# Patient Record
Sex: Female | Born: 1995 | Race: Black or African American | Hispanic: No | Marital: Single | Smoking: Never smoker
Health system: Southern US, Community
[De-identification: ages and names within clinical notes are randomized; demographics above are authoritative.]

## PROBLEM LIST (undated history)

## (undated) DIAGNOSIS — N390 Urinary tract infection, site not specified: Secondary | ICD-10-CM

## (undated) DIAGNOSIS — F32A Depression, unspecified: Secondary | ICD-10-CM

## (undated) DIAGNOSIS — I10 Essential (primary) hypertension: Secondary | ICD-10-CM

## (undated) DIAGNOSIS — F329 Major depressive disorder, single episode, unspecified: Secondary | ICD-10-CM

## (undated) HISTORY — DX: Major depressive disorder, single episode, unspecified: F32.9

## (undated) HISTORY — DX: Depression, unspecified: F32.A

## (undated) HISTORY — DX: Essential (primary) hypertension: I10

## (undated) HISTORY — DX: Urinary tract infection, site not specified: N39.0

---

## 2018-09-10 ENCOUNTER — Encounter: Payer: Self-pay | Admitting: Family Medicine

## 2018-09-10 ENCOUNTER — Ambulatory Visit (INDEPENDENT_AMBULATORY_CARE_PROVIDER_SITE_OTHER): Payer: Managed Care, Other (non HMO) | Admitting: Family Medicine

## 2018-09-10 VITALS — BP 110/70 | HR 76 | Temp 98.9°F | Ht 66.0 in | Wt 141.0 lb

## 2018-09-10 DIAGNOSIS — Z87898 Personal history of other specified conditions: Secondary | ICD-10-CM | POA: Diagnosis not present

## 2018-09-10 DIAGNOSIS — R634 Abnormal weight loss: Secondary | ICD-10-CM | POA: Diagnosis not present

## 2018-09-10 DIAGNOSIS — Z7689 Persons encountering health services in other specified circumstances: Secondary | ICD-10-CM | POA: Diagnosis not present

## 2018-09-10 LAB — POCT GLYCOSYLATED HEMOGLOBIN (HGB A1C): Hemoglobin A1C: 5.1 % (ref 4.0–5.6)

## 2018-09-10 NOTE — Patient Instructions (Signed)

## 2018-09-10 NOTE — Progress Notes (Signed)
Patient presents to clinic today to establish care.  SUBJECTIVE: PMH:  Pt is a 23 yo female with pmh sig for preDM.   Pt previously seen in [place], [place].  PreDM?: -pt states she was told this in the past -does not recall what her hgb A1C was -states her mother has preDM -denies increased thirst, hunger, or urinary frequency  Weight loss: -x 2 wks -may only eat once a day if that. -notes decreased appetite -was 156 lbs, now 141 lbs -drinking 1-2 bottles of water per day -denies constipation, diarrhea, palpitations, hair loss, irregular menses.  Allergies: NKDA  Social Hx:  Pt is engagged.  Pt has two children, a 2 yo and a 9 mo.  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  Pt was working at a [employer].  Pt endorses rare social EtOH use.  Pt denies tobacco and drug use.  Family medical history: Mom-prediabetes, miscarriage Dad-hypertension MGM-colon cancer, diabetes, glaucoma   History reviewed. No pertinent past medical history.  History reviewed. No pertinent surgical history.  No current outpatient medications on file prior to visit.   No current facility-administered medications on file prior to visit.     No Known Allergies  History reviewed. No pertinent family history.  Social History   Socioeconomic History  . Marital status: Single    Spouse name: Not on file  . Number of children: Not on file  . Years of education: Not on file  . Highest education level: Not on file  Occupational History  . Not on file  Social Needs  . Financial resource strain: Not on file  . Food insecurity:    Worry: Not on file    Inability: Not on file  . Transportation needs:    Medical: Not on file    Non-medical: Not on file  Tobacco Use  . Smoking status: Never Smoker  . Smokeless tobacco: Never Used  Substance and Sexual Activity  . Alcohol use: Yes  . Drug use: Never  . Sexual activity: Yes  Lifestyle  . Physical activity:    Days  per week: Not on file    Minutes per session: Not on file  . Stress: Not on file  Relationships  . Social connections:    Talks on phone: Not on file    Gets together: Not on file    Attends religious service: Not on file    Active member of club or organization: Not on file    Attends meetings of clubs or organizations: Not on file    Relationship status: Not on file  . Intimate partner violence:    Fear of current or ex partner: Not on file    Emotionally abused: Not on file    Physically abused: Not on file    Forced sexual activity: Not on file  Other Topics Concern  . Not on file  Social History Narrative  . Not on file    ROS General: Denies fever, chills, night sweats, changes in appetite  +changes in weight HEENT: Denies headaches, ear pain, changes in vision, rhinorrhea, sore throat CV: Denies CP, palpitations, SOB, orthopnea Pulm: Denies SOB, cough, wheezing GI: Denies abdominal pain, nausea, vomiting, diarrhea, constipation GU: Denies dysuria, hematuria, frequency, vaginal discharge Msk: Denies muscle cramps, joint pains Neuro: Denies weakness, numbness, tingling Skin: Denies rashes, bruising Psych: Denies depression, anxiety, hallucinations  BP 110/70 (BP Location: Left Arm, Patient Position: Sitting, Cuff Size: Normal)  Pulse 76   Temp 98.9 F (37.2 C) (Oral)   Ht 5\' 6"  (1.676 m)   Wt 141 lb (64 kg)   LMP 08/31/2018 (Exact Date)   SpO2 98%   BMI 22.76 kg/m   Physical Exam Gen. Pleasant, well developed, well-nourished, in NAD HEENT - [place]/AT, PERRL, no scleral icterus, no nasal drainage, pharynx without erythema or exudate. Lungs: no use of accessory muscles, CTAB, no wheezes, rales or rhonchi Cardiovascular: RRR, No r/g/m, no peripheral edema Abdomen: BS present, soft, nontender,nondistended Neuro:  A&Ox3, CN II-XII intact, normal gait Skin:  Warm, dry, intact, no lesions  No results found for this or any previous visit (from the past 2160  hour(s)).  Assessment/Plan: Weight loss -Discussed likely 2/2 patient's decreased appetite/not eating -Discussed ways to manage stress which could also be affecting patient's weight -Discussed obtaining labs.  Will wait until patient schedule CPE. -Patient encouraged to schedule eating small meals throughout the day. -PHQ 9 score 7 -Gad 7 score 5  History of prediabetes  - Plan: POCT glycosylated hemoglobin (Hb A1C)  5.1%  Encounter to establish care -We reviewed the PMH, PSH, FH, SH, Meds and Allergies. -We provided refills for any medications we will prescribe as needed. -We addressed current concerns per orders and patient instructions. -We have asked for records for pertinent exams, studies, vaccines and notes from previous providers. -We have advised patient to follow up per instructions below.  F/u in the next few wks for CPE  Abbe Amsterdam, MD

## 2018-09-19 ENCOUNTER — Encounter: Payer: Managed Care, Other (non HMO) | Admitting: Family Medicine

## 2019-02-14 ENCOUNTER — Encounter (HOSPITAL_COMMUNITY): Payer: Self-pay | Admitting: Emergency Medicine

## 2019-02-14 ENCOUNTER — Emergency Department (HOSPITAL_COMMUNITY)
Admission: EM | Admit: 2019-02-14 | Discharge: 2019-02-14 | Disposition: A | Discharge status: Home / Self Care | Payer: Medicaid Other | Attending: Emergency Medicine | Admitting: Emergency Medicine

## 2019-02-14 ENCOUNTER — Other Ambulatory Visit: Payer: Self-pay

## 2019-02-14 DIAGNOSIS — Z008 Encounter for other general examination: Secondary | ICD-10-CM | POA: Diagnosis present

## 2019-02-14 DIAGNOSIS — I10 Essential (primary) hypertension: Secondary | ICD-10-CM | POA: Diagnosis not present

## 2019-02-14 DIAGNOSIS — Z630 Problems in relationship with spouse or partner: Secondary | ICD-10-CM | POA: Diagnosis not present

## 2019-02-14 DIAGNOSIS — F10920 Alcohol use, unspecified with intoxication, uncomplicated: Secondary | ICD-10-CM | POA: Diagnosis not present

## 2019-02-14 DIAGNOSIS — F329 Major depressive disorder, single episode, unspecified: Secondary | ICD-10-CM | POA: Insufficient documentation

## 2019-02-14 DIAGNOSIS — F4321 Adjustment disorder with depressed mood: Secondary | ICD-10-CM

## 2019-02-14 DIAGNOSIS — R45851 Suicidal ideations: Secondary | ICD-10-CM | POA: Diagnosis not present

## 2019-02-14 LAB — ACETAMINOPHEN LEVEL: Acetaminophen (Tylenol), Serum: <10 ug/mL — ABNORMAL LOW (ref 10–30)

## 2019-02-14 LAB — CBC WITH DIFFERENTIAL/PLATELET
Abs Immature Granulocytes: 0.03 10*3/uL (ref 0.00–0.07)
Basophils Absolute: 0 10*3/uL (ref 0.0–0.1)
Basophils Relative: 0 %
Eosinophils Absolute: 0.1 10*3/uL (ref 0.0–0.5)
Eosinophils Relative: 1 %
HCT: 39.4 % (ref 36.0–46.0)
Hemoglobin: 12.3 g/dL (ref 12.0–15.0)
Immature Granulocytes: 0 %
Lymphocytes Relative: 33 %
Lymphs Abs: 2.3 10*3/uL (ref 0.7–4.0)
MCH: 28 pg (ref 26.0–34.0)
MCHC: 31.2 g/dL (ref 30.0–36.0)
MCV: 89.5 fL (ref 80.0–100.0)
Monocytes Absolute: 0.4 10*3/uL (ref 0.1–1.0)
Monocytes Relative: 6 %
Neutro Abs: 4.2 10*3/uL (ref 1.7–7.7)
Neutrophils Relative %: 60 %
Platelets: 207 10*3/uL (ref 150–400)
RBC: 4.4 MIL/uL (ref 3.87–5.11)
RDW: 12.7 % (ref 11.5–15.5)
WBC: 6.9 10*3/uL (ref 4.0–10.5)
nRBC: 0 % (ref 0.0–0.2)

## 2019-02-14 LAB — COMPREHENSIVE METABOLIC PANEL
ALT: 15 U/L (ref 0–44)
AST: 19 U/L (ref 15–41)
Albumin: 4.5 g/dL (ref 3.5–5.0)
Alkaline Phosphatase: 49 U/L (ref 38–126)
Anion gap: 10 (ref 5–15)
BUN: 11 mg/dL (ref 6–20)
CO2: 25 mmol/L (ref 22–32)
Calcium: 9.3 mg/dL (ref 8.9–10.3)
Chloride: 108 mmol/L (ref 98–111)
Creatinine, Ser: 0.75 mg/dL (ref 0.44–1.00)
GFR calc Af Amer: >60 mL/min (ref >60)
GFR calc non Af Amer: >60 mL/min (ref >60)
Glucose, Bld: 87 mg/dL (ref 70–99)
Potassium: 3.8 mmol/L (ref 3.5–5.1)
Sodium: 143 mmol/L (ref 135–145)
Total Bilirubin: 0.5 mg/dL (ref 0.3–1.2)
Total Protein: 8.1 g/dL (ref 6.5–8.1)

## 2019-02-14 LAB — I-STAT BETA HCG BLOOD, ED (MC, WL, AP ONLY): I-stat hCG, quantitative: <5 m[IU]/mL (ref <5)

## 2019-02-14 LAB — RAPID URINE DRUG SCREEN, HOSP PERFORMED
Amphetamines: NOT DETECTED
Barbiturates: NOT DETECTED
Benzodiazepines: NOT DETECTED
Cocaine: NOT DETECTED
Opiates: NOT DETECTED
Tetrahydrocannabinol: NOT DETECTED

## 2019-02-14 LAB — SALICYLATE LEVEL: Salicylate Lvl: <7 mg/dL (ref 2.8–30.0)

## 2019-02-14 LAB — ETHANOL: Alcohol, Ethyl (B): 130 mg/dL — ABNORMAL HIGH (ref <10)

## 2019-02-14 NOTE — ED Notes (Signed)
Patient called a cab for ride home.

## 2019-02-14 NOTE — Discharge Instructions (Signed)
For your behavioral health needs, you are advised to follow up with one of the providers listed below.  They offer outpatient psychiatry, counseling, and substance abuse treatment.  Contact them at your earliest opportunity to ask about scheduling an intake appointment:       [facility]. [address], [place] [address]      [phone]       [facility] [place], [place] [address]      [phone]      New patients are seen at their walk-in clinic.  Walk-in hours are Monday - Friday from 8:30 am - 12:00 pm, and from 1:00 pm - 2:30 pm.  Walk-in patients are seen on a first come, first served basis, so try to arrive as early as possible for the best chance of being seen the same day.       [facility]      [place], [place] [address]      [phone]

## 2019-02-14 NOTE — ED Triage Notes (Signed)
Pt having marital stress d/t break up with husband . Drinking large amounts of ETOH verbalized SI to EMS staff. Currently intoxicated.

## 2019-02-14 NOTE — ED Notes (Addendum)
Patient given breakfast meal tray.   Patient eating.   Patient denies SI or HI.

## 2019-02-14 NOTE — BH Assessment (Signed)
[facility] Assessment Progress Note  Per Corena Pilgrim, MD, this pt does not require psychiatric hospitalization at this time.  Pt is to be discharged from [facility] with referral information for area behavioral health providers.  Discharge instructions include referrals for the [facility], for [place], and for the [place].  Pt's nurse has been notified.  Jalene Mullet, [facility] Triage Specialist [phone]

## 2019-02-14 NOTE — ED Provider Notes (Signed)
Patient was seen last night for suicidal ideation after she was involved in a verbal argument with her boyfriend.  She was found to be slightly intoxicated.  At this time she denies any SI HI.  She has been seen and evaluated by psychiatry and felt that she is stable for discharge.  She is currently clinically sober.  Will discharge per psych recommendation.  BP (!) 116/53   Pulse 67   Temp 98.4 F (36.9 C) (Oral)   Resp 14   SpO2 97%   Results for orders placed or performed during the hospital encounter of 02/14/19  CBC with Differential  Result Value Ref Range   WBC 6.9 4.0 - 10.5 K/uL   RBC 4.40 3.87 - 5.11 MIL/uL   Hemoglobin 12.3 12.0 - 15.0 g/dL   HCT 39.4 36.0 - 46.0 %   MCV 89.5 80.0 - 100.0 fL   MCH 28.0 26.0 - 34.0 pg   MCHC 31.2 30.0 - 36.0 g/dL   RDW 12.7 11.5 - 15.5 %   Platelets 207 150 - 400 K/uL   nRBC 0.0 0.0 - 0.2 %   Neutrophils Relative % 60 %   Neutro Abs 4.2 1.7 - 7.7 K/uL   Lymphocytes Relative 33 %   Lymphs Abs 2.3 0.7 - 4.0 K/uL   Monocytes Relative 6 %   Monocytes Absolute 0.4 0.1 - 1.0 K/uL   Eosinophils Relative 1 %   Eosinophils Absolute 0.1 0.0 - 0.5 K/uL   Basophils Relative 0 %   Basophils Absolute 0.0 0.0 - 0.1 K/uL   Immature Granulocytes 0 %   Abs Immature Granulocytes 0.03 0.00 - 0.07 K/uL  Comprehensive metabolic panel  Result Value Ref Range   Sodium 143 135 - 145 mmol/L   Potassium 3.8 3.5 - 5.1 mmol/L   Chloride 108 98 - 111 mmol/L   CO2 25 22 - 32 mmol/L   Glucose, Bld 87 70 - 99 mg/dL   BUN 11 6 - 20 mg/dL   Creatinine, Ser 0.75 0.44 - 1.00 mg/dL   Calcium 9.3 8.9 - 10.3 mg/dL   Total Protein 8.1 6.5 - 8.1 g/dL   Albumin 4.5 3.5 - 5.0 g/dL   AST 19 15 - 41 U/L   ALT 15 0 - 44 U/L   Alkaline Phosphatase 49 38 - 126 U/L   Total Bilirubin 0.5 0.3 - 1.2 mg/dL   GFR calc non Af Amer >60 >60 mL/min   GFR calc Af Amer >60 >60 mL/min   Anion gap 10 5 - 15  Ethanol  Result Value Ref Range   Alcohol, Ethyl (B) 130 (H) <10 mg/dL   Rapid urine drug screen (hospital performed)  Result Value Ref Range   Opiates NONE DETECTED NONE DETECTED   Cocaine NONE DETECTED NONE DETECTED   Benzodiazepines NONE DETECTED NONE DETECTED   Amphetamines NONE DETECTED NONE DETECTED   Tetrahydrocannabinol NONE DETECTED NONE DETECTED   Barbiturates NONE DETECTED NONE DETECTED  Acetaminophen level  Result Value Ref Range   Acetaminophen (Tylenol), Serum <10 (L) 10 - 30 ug/mL  Salicylate level  Result Value Ref Range   Salicylate Lvl <1.6 2.8 - 30.0 mg/dL  I-Stat Beta hCG blood, ED ([facility], [facility], [facility] only)  Result Value Ref Range   I-stat hCG, quantitative <5.0 <5 mIU/mL   Comment 3           No results found.    Domenic Moras, PA-C 02/14/19 1231    Virgel Manifold, MD 02/15/19 [phone]

## 2019-02-14 NOTE — Progress Notes (Addendum)
Patient ID: Elizabeth Blake, female   DOB: 1995/08/17, 23 y.o.   MRN: [id number]  Pt was seen and chart reviewed with treatment team and Dr Darleene Cleaver. Pt denies suicidal/homicidal ideation, denies auditory/visual hallucinations and does not appear to be responding to internal stimuli. Pt stated she is divorcing her husband but he is not accepting of this. They were arguing and she attempted to drink her troubles away. BAL 130, UDS negative. She stated she does want to speak to a therapist to learn how to better handle her emotions and reaction to her life circumstances. Pt will be provided outpatient resources for therapy. Pt declined Peer Support services. Pt is psychiatrically clear.   Ethelene Hal, PMHNP-BC 02/14/2019   1552 Patient seen face-to-face for psychiatric evaluation, chart reviewed and case discussed with the physician extender and developed treatment plan. Reviewed the information documented and agree with the treatment plan. Corena Pilgrim, MD

## 2019-02-14 NOTE — ED Provider Notes (Signed)
[facility]-EMERGENCY DEPT Provider Note   CSN: [id number] Arrival date & time: 02/14/19  0007     History   Chief Complaint Chief Complaint  Patient presents with  . Medical Clearance  . Alcohol Intoxication    HPI Elizabeth Blake is a 23 y.o. female.     The history is provided by the patient and medical records.     23 year old female with history of depression, hypertension, presenting to the ED with suicidal ideation.  She recently separated from her husband and has been drinking large amounts of alcohol to cope.  She has been very depressed and voiced SI on arrival to ED.  No plan given.  She is unable to quantify how much alcohol she drank today-- reported to other ED staff that she drank 2 bottles of wine and 2 bottles of Hennessey.  Denies drug use.  Not currently on medications for depression.  Past Medical History:  Diagnosis Date  . Depression   . Hypertension   . UTI (urinary tract infection)     There are no active problems to display for this patient.   No past surgical history on file.   OB History   No obstetric history on file.      Home Medications    Prior to Admission medications   Not on File    Family History Family History  Problem Relation Age of Onset  . Miscarriages / [place] Mother   . Hypertension Father   . Cancer Maternal Grandmother   . Diabetes Maternal Grandmother     Social History Social History   Tobacco Use  . Smoking status: Never Smoker  . Smokeless tobacco: Never Used  Substance Use Topics  . Alcohol use: Yes  . Drug use: Never     Allergies   Patient has no known allergies.   Review of Systems Review of Systems  Psychiatric/Behavioral: Positive for suicidal ideas.  All other systems reviewed and are negative.    Physical Exam Updated Vital Signs BP (!) 132/93 (BP Location: Left Arm)   Pulse 90   Temp 98.3 F (36.8 C) (Oral)   Resp 16   SpO2 100%   Physical  Exam Vitals signs and nursing note reviewed.  Constitutional:      Appearance: She is well-developed.     Comments: Sleeping, awoken for exam but clinically intoxicated  HENT:     Head: Normocephalic and atraumatic.  Eyes:     Conjunctiva/sclera: Conjunctivae normal.     Pupils: Pupils are equal, round, and reactive to light.  Neck:     Musculoskeletal: Normal range of motion.  Cardiovascular:     Rate and Rhythm: Normal rate and regular rhythm.     Heart sounds: Normal heart sounds.  Pulmonary:     Effort: Pulmonary effort is normal.     Breath sounds: Normal breath sounds.  Abdominal:     General: Bowel sounds are normal.     Palpations: Abdomen is soft.  Musculoskeletal: Normal range of motion.  Skin:    General: Skin is warm and dry.  Neurological:     Mental Status: She is alert and oriented to person, place, and time.  Psychiatric:        Thought Content: Thought content includes suicidal ideation.      ED Treatments / Results  Labs (all labs ordered are listed, but only abnormal results are displayed) Labs Reviewed  ETHANOL - Abnormal; Notable for the following components:  Result Value   Alcohol, Ethyl (B) 130 (*)    All other components within normal limits  ACETAMINOPHEN LEVEL - Abnormal; Notable for the following components:   Acetaminophen (Tylenol), Serum <10 (*)    All other components within normal limits  CBC WITH DIFFERENTIAL/PLATELET  COMPREHENSIVE METABOLIC PANEL  SALICYLATE LEVEL  RAPID URINE DRUG SCREEN, HOSP PERFORMED  I-STAT BETA HCG BLOOD, ED ([facility], [facility], [facility] ONLY)    EKG None  Radiology No results found.  Procedures Procedures (including critical care time)  Medications Ordered in ED Medications - No data to display   Initial Impression / Assessment and Plan / ED Course  I have reviewed the triage vital signs and the nursing notes.  Pertinent labs & imaging results that were available during my care of the patient were reviewed  by me and considered in my medical decision making (see chart for details).  23 year old female here acutely intoxicated with reported suicidal ideation.  Recent separation from her husband and has been depressed.  No specific plan voiced.  She appears intoxicated here but is in no acute distress.  Screening of the overall reassuring, ethanol 130.  Patient is medically cleared.  She has been resting comfortably.  Will get TTS consult.  6:26 AM TTS evaluation pending.  Care will be signed out to morning provider to follow-up on their recommendations.  Final Clinical Impressions(s) / ED Diagnoses   Final diagnoses:  Alcoholic intoxication without complication [facility])  Suicidal ideation    ED Discharge Orders    None       Larene Pickett, PA-C 02/14/19 5093    Merrily Pew, MD 02/15/19 [phone]

## 2020-01-08 DIAGNOSIS — Z419 Encounter for procedure for purposes other than remedying health state, unspecified: Secondary | ICD-10-CM | POA: Diagnosis not present

## 2020-02-05 DIAGNOSIS — O09899 Supervision of other high risk pregnancies, unspecified trimester: Secondary | ICD-10-CM | POA: Diagnosis not present

## 2020-02-08 DIAGNOSIS — Z419 Encounter for procedure for purposes other than remedying health state, unspecified: Secondary | ICD-10-CM | POA: Diagnosis not present

## 2020-02-19 DIAGNOSIS — O09899 Supervision of other high risk pregnancies, unspecified trimester: Secondary | ICD-10-CM | POA: Diagnosis not present

## 2020-03-04 DIAGNOSIS — O09899 Supervision of other high risk pregnancies, unspecified trimester: Secondary | ICD-10-CM | POA: Diagnosis not present

## 2020-03-10 DIAGNOSIS — Z419 Encounter for procedure for purposes other than remedying health state, unspecified: Secondary | ICD-10-CM | POA: Diagnosis not present

## 2020-04-09 DIAGNOSIS — Z419 Encounter for procedure for purposes other than remedying health state, unspecified: Secondary | ICD-10-CM | POA: Diagnosis not present

## 2020-05-10 DIAGNOSIS — Z419 Encounter for procedure for purposes other than remedying health state, unspecified: Secondary | ICD-10-CM | POA: Diagnosis not present

## 2020-06-09 DIAGNOSIS — Z419 Encounter for procedure for purposes other than remedying health state, unspecified: Secondary | ICD-10-CM | POA: Diagnosis not present

## 2020-07-10 DIAGNOSIS — Z419 Encounter for procedure for purposes other than remedying health state, unspecified: Secondary | ICD-10-CM | POA: Diagnosis not present

## 2020-08-10 DIAGNOSIS — Z419 Encounter for procedure for purposes other than remedying health state, unspecified: Secondary | ICD-10-CM | POA: Diagnosis not present

## 2020-09-07 DIAGNOSIS — Z419 Encounter for procedure for purposes other than remedying health state, unspecified: Secondary | ICD-10-CM | POA: Diagnosis not present

## 2020-10-08 DIAGNOSIS — Z419 Encounter for procedure for purposes other than remedying health state, unspecified: Secondary | ICD-10-CM | POA: Diagnosis not present

## 2020-10-16 DIAGNOSIS — B354 Tinea corporis: Secondary | ICD-10-CM | POA: Diagnosis not present

## 2020-10-16 DIAGNOSIS — N3000 Acute cystitis without hematuria: Secondary | ICD-10-CM | POA: Diagnosis not present

## 2020-11-07 DIAGNOSIS — Z419 Encounter for procedure for purposes other than remedying health state, unspecified: Secondary | ICD-10-CM | POA: Diagnosis not present

## 2020-12-08 DIAGNOSIS — Z419 Encounter for procedure for purposes other than remedying health state, unspecified: Secondary | ICD-10-CM | POA: Diagnosis not present

## 2020-12-09 DIAGNOSIS — Z3042 Encounter for surveillance of injectable contraceptive: Secondary | ICD-10-CM | POA: Diagnosis not present

## 2020-12-13 DIAGNOSIS — L309 Dermatitis, unspecified: Secondary | ICD-10-CM | POA: Diagnosis not present

## 2021-01-07 DIAGNOSIS — Z419 Encounter for procedure for purposes other than remedying health state, unspecified: Secondary | ICD-10-CM | POA: Diagnosis not present

## 2021-02-07 DIAGNOSIS — Z419 Encounter for procedure for purposes other than remedying health state, unspecified: Secondary | ICD-10-CM | POA: Diagnosis not present

## 2021-03-10 DIAGNOSIS — Z419 Encounter for procedure for purposes other than remedying health state, unspecified: Secondary | ICD-10-CM | POA: Diagnosis not present

## 2021-04-05 ENCOUNTER — Other Ambulatory Visit: Payer: Self-pay

## 2021-04-05 ENCOUNTER — Ambulatory Visit (HOSPITAL_COMMUNITY)
Admission: EM | Admit: 2021-04-05 | Discharge: 2021-04-05 | Disposition: A | Discharge status: Home / Self Care | Payer: 59 | Attending: Student | Admitting: Student

## 2021-04-05 ENCOUNTER — Encounter (HOSPITAL_COMMUNITY): Payer: Self-pay | Admitting: Emergency Medicine

## 2021-04-05 DIAGNOSIS — Z1152 Encounter for screening for COVID-19: Secondary | ICD-10-CM | POA: Diagnosis present

## 2021-04-05 DIAGNOSIS — R509 Fever, unspecified: Secondary | ICD-10-CM

## 2021-04-05 LAB — POC INFLUENZA A AND B ANTIGEN (URGENT CARE ONLY)
INFLUENZA A ANTIGEN, POC: NEGATIVE
INFLUENZA B ANTIGEN, POC: NEGATIVE

## 2021-04-05 NOTE — Discharge Instructions (Addendum)
-  You can take Tylenol up to 1000 mg 3 times daily, and ibuprofen up to 600 mg 3 times daily with food.  You can take these together, or alternate every 3-4 hours. -Mucinex, dayquil, nyquil, etc -With a virus, you're typically contagious for 5-7 days, or as long as you're having fevers.

## 2021-04-05 NOTE — ED Provider Notes (Signed)
[facility]    CSN: [id number] Arrival date & time: 04/05/21  1702      History   Chief Complaint Chief Complaint  Patient presents with   flu like symptoms     HPI Elizabeth Blake is a 25 y.o. female presenting with viral symptoms for about 3 days.  Medical history noncontributory.  Notes subjective chills, generalized body aches, nausea without vomiting, throbbing headaches, few episodes of diarrhea, chest congestion, nonproductive cough.  Has not tried medications for symptoms. Has not monitored temperature at home.  HPI  Past Medical History:  Diagnosis Date   Depression    Hypertension    UTI (urinary tract infection)     Patient Active Problem List   Diagnosis Date Noted   Adjustment disorder with depressed mood 02/14/2019    History reviewed. No pertinent surgical history.  OB History   No obstetric history on file.      Home Medications    Prior to Admission medications   Not on File    Family History Family History  Problem Relation Age of Onset   Miscarriages / [place] Mother    Hypertension Father    Cancer Maternal Grandmother    Diabetes Maternal Grandmother     Social History Social History   Tobacco Use   Smoking status: Never   Smokeless tobacco: Never  Vaping Use   Vaping Use: Never used  Substance Use Topics   Alcohol use: Yes   Drug use: Never     Allergies   Patient has no known allergies.   Review of Systems Review of Systems  Constitutional:  Positive for chills and fever. Negative for appetite change.  HENT:  Positive for congestion and sore throat. Negative for ear pain, rhinorrhea, sinus pressure and sinus pain.   Eyes:  Negative for redness and visual disturbance.  Respiratory:  Positive for cough. Negative for chest tightness, shortness of breath and wheezing.   Cardiovascular:  Negative for chest pain and palpitations.  Gastrointestinal:  Negative for abdominal pain, constipation, diarrhea,  nausea and vomiting.  Genitourinary:  Negative for dysuria, frequency and urgency.  Musculoskeletal:  Positive for myalgias.  Neurological:  Positive for headaches. Negative for dizziness and weakness.  Psychiatric/Behavioral:  Negative for confusion.   All other systems reviewed and are negative.   Physical Exam Triage Vital Signs ED Triage Vitals  Enc Vitals Group     BP 04/05/21 1754 132/82     Pulse Rate 04/05/21 1754 (!) 108     Resp --      Temp 04/05/21 1754 100.1 F (37.8 C)     Temp Source 04/05/21 1754 Oral     SpO2 04/05/21 1754 95 %     Weight --      Height --      Head Circumference --      Peak Flow --      Pain Score 04/05/21 1756 8     Pain Loc --      Pain Edu? --      Excl. in GC? --    No data found.  Updated Vital Signs BP 132/82 (BP Location: Right Arm)   Pulse (!) 108   Temp 100.1 F (37.8 C) (Oral)   LMP  (LMP Unknown)   SpO2 95%   Visual Acuity Right Eye Distance:   Left Eye Distance:   Bilateral Distance:    Right Eye Near:   Left Eye Near:    Bilateral Near:  Physical Exam Vitals reviewed.  Constitutional:      General: She is not in acute distress.    Appearance: Normal appearance. She is ill-appearing.  HENT:     Head: Normocephalic and atraumatic.     Right Ear: Tympanic membrane, ear canal and external ear normal. No tenderness. No middle ear effusion. There is no impacted cerumen. Tympanic membrane is not perforated, erythematous, retracted or bulging.     Left Ear: Tympanic membrane, ear canal and external ear normal. No tenderness.  No middle ear effusion. There is no impacted cerumen. Tympanic membrane is not perforated, erythematous, retracted or bulging.     Nose: Nose normal. No congestion.     Mouth/Throat:     Mouth: Mucous membranes are moist.     Pharynx: Uvula midline. No oropharyngeal exudate or posterior oropharyngeal erythema.  Eyes:     Extraocular Movements: Extraocular movements intact.     Pupils:  Pupils are equal, round, and reactive to light.  Cardiovascular:     Rate and Rhythm: Normal rate and regular rhythm.     Heart sounds: Normal heart sounds.  Pulmonary:     Effort: Pulmonary effort is normal.     Breath sounds: Normal breath sounds. No decreased breath sounds, wheezing, rhonchi or rales.  Abdominal:     Palpations: Abdomen is soft.     Tenderness: There is no abdominal tenderness. There is no guarding or rebound.  Lymphadenopathy:     Cervical: No cervical adenopathy.     Right cervical: No superficial cervical adenopathy.    Left cervical: No superficial cervical adenopathy.  Neurological:     General: No focal deficit present.     Mental Status: She is alert and oriented to person, place, and time.  Psychiatric:        Mood and Affect: Mood normal.        Behavior: Behavior normal.        Thought Content: Thought content normal.        Judgment: Judgment normal.     UC Treatments / Results  Labs (all labs ordered are listed, but only abnormal results are displayed) Labs Reviewed  SARS CORONAVIRUS 2 (TAT 6-24 HRS)  POC INFLUENZA A AND B ANTIGEN (URGENT CARE ONLY)    EKG   Radiology No results found.  Procedures Procedures (including critical care time)  Medications Ordered in UC Medications - No data to display  Initial Impression / Assessment and Plan / UC Course  I have reviewed the triage vital signs and the nursing notes.  Pertinent labs & imaging results that were available during my care of the patient were reviewed by me and considered in my medical decision making (see chart for details).     This patient is a very pleasant 25 y.o. year old female presenting with febrile illness. Today this pt is borderline febrile and tachycardic but nontachypneic, oxygenating well on room air, no wheezes rhonchi or rales.   Rapid influenza negative. COVID PCR sent.  Patient prefers over-the-counter medications for symptomatic relief.   Work note  provided. ED return precautions discussed. Patient verbalizes understanding and agreement.    Final Clinical Impressions(s) / UC Diagnoses   Final diagnoses:  Febrile illness  Encounter for screening for COVID-19     Discharge Instructions      -You can take Tylenol up to 1000 mg 3 times daily, and ibuprofen up to 600 mg 3 times daily with food.  You can take these together, or alternate  every 3-4 hours. -Mucinex, dayquil, nyquil, etc -With a virus, you're typically contagious for 5-7 days, or as long as you're having fevers.      ED Prescriptions   None    PDMP not reviewed this encounter.   Rhys Martini, PA-C 04/05/21 [phone]

## 2021-04-05 NOTE — ED Triage Notes (Signed)
Pt c/o fever, bodyaches, nausea, HA, diarrhea, and chest congestion x 2 days.

## 2021-04-06 LAB — SARS CORONAVIRUS 2 (TAT 6-24 HRS): SARS Coronavirus 2: NEGATIVE

## 2021-04-09 DIAGNOSIS — Z419 Encounter for procedure for purposes other than remedying health state, unspecified: Secondary | ICD-10-CM | POA: Diagnosis not present

## 2021-04-16 ENCOUNTER — Emergency Department (HOSPITAL_COMMUNITY)
Admission: EM | Admit: 2021-04-16 | Discharge: 2021-04-18 | Disposition: A | Discharge status: Home / Self Care | Payer: 59 | Attending: Emergency Medicine | Admitting: Emergency Medicine

## 2021-04-16 ENCOUNTER — Other Ambulatory Visit: Payer: Self-pay

## 2021-04-16 ENCOUNTER — Emergency Department (HOSPITAL_COMMUNITY): Payer: 59

## 2021-04-16 DIAGNOSIS — N9489 Other specified conditions associated with female genital organs and menstrual cycle: Secondary | ICD-10-CM | POA: Diagnosis not present

## 2021-04-16 DIAGNOSIS — Z20822 Contact with and (suspected) exposure to covid-19: Secondary | ICD-10-CM | POA: Insufficient documentation

## 2021-04-16 DIAGNOSIS — F322 Major depressive disorder, single episode, severe without psychotic features: Secondary | ICD-10-CM | POA: Diagnosis not present

## 2021-04-16 DIAGNOSIS — X838XXA Intentional self-harm by other specified means, initial encounter: Secondary | ICD-10-CM | POA: Insufficient documentation

## 2021-04-16 DIAGNOSIS — T50902A Poisoning by unspecified drugs, medicaments and biological substances, intentional self-harm, initial encounter: Secondary | ICD-10-CM | POA: Diagnosis not present

## 2021-04-16 DIAGNOSIS — I10 Essential (primary) hypertension: Secondary | ICD-10-CM | POA: Diagnosis not present

## 2021-04-16 DIAGNOSIS — F32A Depression, unspecified: Secondary | ICD-10-CM | POA: Diagnosis present

## 2021-04-16 DIAGNOSIS — Y907 Blood alcohol level of 200-239 mg/100 ml: Secondary | ICD-10-CM | POA: Diagnosis not present

## 2021-04-16 LAB — ETHANOL: Alcohol, Ethyl (B): 212 mg/dL — ABNORMAL HIGH (ref <10)

## 2021-04-16 LAB — I-STAT BETA HCG BLOOD, ED (MC, WL, AP ONLY): I-stat hCG, quantitative: 6.3 m[IU]/mL — ABNORMAL HIGH (ref <5)

## 2021-04-16 LAB — CBC WITH DIFFERENTIAL/PLATELET
Abs Immature Granulocytes: 0.02 10*3/uL (ref 0.00–0.07)
Basophils Absolute: 0 10*3/uL (ref 0.0–0.1)
Basophils Relative: 0 %
Eosinophils Absolute: 0.1 10*3/uL (ref 0.0–0.5)
Eosinophils Relative: 1 %
HCT: 39 % (ref 36.0–46.0)
Hemoglobin: 12.5 g/dL (ref 12.0–15.0)
Immature Granulocytes: 0 %
Lymphocytes Relative: 34 %
Lymphs Abs: 2.3 10*3/uL (ref 0.7–4.0)
MCH: 27.7 pg (ref 26.0–34.0)
MCHC: 32.1 g/dL (ref 30.0–36.0)
MCV: 86.3 fL (ref 80.0–100.0)
Monocytes Absolute: 0.5 10*3/uL (ref 0.1–1.0)
Monocytes Relative: 8 %
Neutro Abs: 3.9 10*3/uL (ref 1.7–7.7)
Neutrophils Relative %: 57 %
Platelets: 292 10*3/uL (ref 150–400)
RBC: 4.52 MIL/uL (ref 3.87–5.11)
RDW: 12.9 % (ref 11.5–15.5)
WBC: 6.9 10*3/uL (ref 4.0–10.5)
nRBC: 0 % (ref 0.0–0.2)

## 2021-04-16 LAB — URINALYSIS, ROUTINE W REFLEX MICROSCOPIC
Bilirubin Urine: NEGATIVE
Glucose, UA: NEGATIVE mg/dL
Hgb urine dipstick: NEGATIVE
Ketones, ur: NEGATIVE mg/dL
Leukocytes,Ua: NEGATIVE
Nitrite: NEGATIVE
Protein, ur: NEGATIVE mg/dL
Specific Gravity, Urine: 1.002 — ABNORMAL LOW (ref 1.005–1.030)
pH: 7 (ref 5.0–8.0)

## 2021-04-16 LAB — PROTIME-INR
INR: 1 (ref 0.8–1.2)
Prothrombin Time: 13.7 seconds (ref 11.4–15.2)

## 2021-04-16 LAB — COMPREHENSIVE METABOLIC PANEL
ALT: 20 U/L (ref 0–44)
AST: 26 U/L (ref 15–41)
Albumin: 4.9 g/dL (ref 3.5–5.0)
Alkaline Phosphatase: 55 U/L (ref 38–126)
Anion gap: 9 (ref 5–15)
BUN: 8 mg/dL (ref 6–20)
CO2: 24 mmol/L (ref 22–32)
Calcium: 9.4 mg/dL (ref 8.9–10.3)
Chloride: 112 mmol/L — ABNORMAL HIGH (ref 98–111)
Creatinine, Ser: 0.85 mg/dL (ref 0.44–1.00)
GFR, Estimated: >60 mL/min (ref >60)
Glucose, Bld: 108 mg/dL — ABNORMAL HIGH (ref 70–99)
Potassium: 3.6 mmol/L (ref 3.5–5.1)
Sodium: 145 mmol/L (ref 135–145)
Total Bilirubin: 0.4 mg/dL (ref 0.3–1.2)
Total Protein: 8.3 g/dL — ABNORMAL HIGH (ref 6.5–8.1)

## 2021-04-16 LAB — BLOOD GAS, VENOUS
Acid-base deficit: 1.2 mmol/L (ref 0.0–2.0)
Bicarbonate: 23.9 mmol/L (ref 20.0–28.0)
O2 Saturation: 59.9 %
Patient temperature: 98.6
pCO2, Ven: 44.1 mmHg (ref 44.0–60.0)
pH, Ven: 7.354 (ref 7.250–7.430)
pO2, Ven: 36.9 mmHg (ref 32.0–45.0)

## 2021-04-16 LAB — RAPID URINE DRUG SCREEN, HOSP PERFORMED
Amphetamines: NOT DETECTED
Barbiturates: NOT DETECTED
Benzodiazepines: NOT DETECTED
Cocaine: NOT DETECTED
Opiates: NOT DETECTED
Tetrahydrocannabinol: NOT DETECTED

## 2021-04-16 LAB — SALICYLATE LEVEL: Salicylate Lvl: <7 mg/dL — ABNORMAL LOW (ref 7.0–30.0)

## 2021-04-16 LAB — IRON
Iron: 83 ug/dL (ref 28–170)
Iron: 256 ug/dL — ABNORMAL HIGH (ref 28–170)

## 2021-04-16 LAB — LIPASE, BLOOD: Lipase: 246 U/L — ABNORMAL HIGH (ref 11–51)

## 2021-04-16 LAB — ACETAMINOPHEN LEVEL: Acetaminophen (Tylenol), Serum: <10 ug/mL — ABNORMAL LOW (ref 10–30)

## 2021-04-16 LAB — PREGNANCY, URINE: Preg Test, Ur: NEGATIVE

## 2021-04-16 LAB — RESP PANEL BY RT-PCR (FLU A&B, COVID) ARPGX2
Influenza A by PCR: NEGATIVE
Influenza B by PCR: NEGATIVE
SARS Coronavirus 2 by RT PCR: NEGATIVE

## 2021-04-16 LAB — APTT: aPTT: 25 seconds (ref 24–36)

## 2021-04-16 MED ORDER — ONDANSETRON HCL 4 MG/2ML IJ SOLN
4.0000 mg | Freq: Once | INTRAMUSCULAR | Status: AC
  Administered 2021-04-16: 4 mg via INTRAVENOUS
  Filled 2021-04-16: qty 2

## 2021-04-16 MED ORDER — LACTATED RINGERS IV BOLUS
1000.0000 mL | Freq: Once | INTRAVENOUS | Status: AC
  Administered 2021-04-16: 1000 mL via INTRAVENOUS

## 2021-04-16 MED ORDER — SODIUM CHLORIDE 0.9 % IV BOLUS
1000.0000 mL | Freq: Once | INTRAVENOUS | Status: AC
  Administered 2021-04-16: 1000 mL via INTRAVENOUS

## 2021-04-16 NOTE — ED Provider Notes (Signed)
[facility]-EMERGENCY DEPT Provider Note   CSN: [id number] Arrival date & time: 04/16/21  0932     History Chief Complaint  Patient presents with   SI   Drug Overdose    Elizabeth Blake is a 25 y.o. female.  HPI  24 year old female with past medical history of HTN presents the emergency department after intentional overdose.  Patient states she has been having increased stress at home taking care of her 3 kids, unable to get a job.  Feels like she cannot provide for her family.  She has been sad and depressed and wanted to take iron pills this morning as a "cry for help".  She is not sure if she wanted to kill herself or die.  Patient states that 6 AM after her husband left for work she opened a bottle of wine and took a handful of iron pills.  She takes iron pills daily for anemia. She states it was a newer bottle, she took all of the pills that were left. Bottle shows long acting iron.  Initially she threw up some of the pills but then she forced those down with more wine.  Denies any Tylenol, salicylate or other pill ingestion.  Denies any previous suicide attempt.  Currently she is complaining of mild nausea with one episode of emesis.  Denies any chest pain or shortness of breath.  Past Medical History:  Diagnosis Date   Depression    Hypertension    UTI (urinary tract infection)     Patient Active Problem List   Diagnosis Date Noted   Adjustment disorder with depressed mood 02/14/2019    No past surgical history on file.   OB History   No obstetric history on file.     Family History  Problem Relation Age of Onset   Miscarriages / Stillbirths Mother    Hypertension Father    Cancer Maternal Grandmother    Diabetes Maternal Grandmother     Social History   Tobacco Use   Smoking status: Never   Smokeless tobacco: Never  Vaping Use   Vaping Use: Never used  Substance Use Topics   Alcohol use: Yes   Drug use: Never    Home  Medications Prior to Admission medications   Not on File    Allergies    Patient has no known allergies.  Review of Systems   Review of Systems  Constitutional:  Negative for chills and fever.  HENT:  Negative for congestion.   Eyes:  Negative for visual disturbance.  Respiratory:  Negative for shortness of breath.   Cardiovascular:  Negative for chest pain.  Gastrointestinal:  Positive for nausea. Negative for abdominal pain, diarrhea and vomiting.  Genitourinary:  Negative for dysuria.  Skin:  Negative for rash.  Neurological:  Negative for headaches.  Psychiatric/Behavioral:  Positive for agitation, self-injury and suicidal ideas.    Physical Exam Updated Vital Signs BP 113/66   Pulse 65   Temp 98 F (36.7 C) (Oral)   Resp (!) 22   Ht 5\' 6"  (1.676 m)   Wt 64 kg   LMP  (LMP Unknown)   SpO2 100%   BMI 22.77 kg/m   Physical Exam Vitals and nursing note reviewed.  Constitutional:      Appearance: Normal appearance.     Comments: Tearful  HENT:     Head: Normocephalic.     Mouth/Throat:     Mouth: Mucous membranes are moist.  Eyes:  Pupils: Pupils are equal, round, and reactive to light.  Cardiovascular:     Rate and Rhythm: Normal rate.  Pulmonary:     Effort: Pulmonary effort is normal. No respiratory distress.  Abdominal:     Palpations: Abdomen is soft.     Tenderness: There is no abdominal tenderness.  Skin:    General: Skin is warm.  Neurological:     Mental Status: She is alert and oriented to person, place, and time. Mental status is at baseline.  Psychiatric:     Comments: Tearful and anxious    ED Results / Procedures / Treatments   Labs (all labs ordered are listed, but only abnormal results are displayed) Labs Reviewed  COMPREHENSIVE METABOLIC PANEL - Abnormal; Notable for the following components:      Result Value   Chloride 112 (*)    Glucose, Bld 108 (*)    Total Protein 8.3 (*)    All other components within normal limits   LIPASE, BLOOD - Abnormal; Notable for the following components:   Lipase 246 (*)    All other components within normal limits  URINALYSIS, ROUTINE W REFLEX MICROSCOPIC - Abnormal; Notable for the following components:   Color, Urine COLORLESS (*)    Specific Gravity, Urine 1.002 (*)    All other components within normal limits  ETHANOL - Abnormal; Notable for the following components:   Alcohol, Ethyl (B) 212 (*)    All other components within normal limits  SALICYLATE LEVEL - Abnormal; Notable for the following components:   Salicylate Lvl <7.0 (*)    All other components within normal limits  ACETAMINOPHEN LEVEL - Abnormal; Notable for the following components:   Acetaminophen (Tylenol), Serum <10 (*)    All other components within normal limits  I-STAT BETA HCG BLOOD, ED ([facility], [facility], [facility] ONLY) - Abnormal; Notable for the following components:   I-stat hCG, quantitative 6.3 (*)    All other components within normal limits  CBC WITH DIFFERENTIAL/PLATELET  RAPID URINE DRUG SCREEN, HOSP PERFORMED  PROTIME-INR  BLOOD GAS, VENOUS  APTT  PREGNANCY, URINE  IRON    EKG None  Radiology No results found.  Procedures .Critical Care Performed by: Rozelle Logan, DO Authorized by: Rozelle Logan, DO   Critical care provider statement:    Critical care time (minutes):  45   Critical care time was exclusive of:  Separately billable procedures and treating other patients   Critical care was necessary to treat or prevent imminent or life-threatening deterioration of the following conditions:  Toxidrome   Critical care was time spent personally by me on the following activities:  Discussions with consultants, evaluation of patient's response to treatment, examination of patient, ordering and performing treatments and interventions, ordering and review of laboratory studies, ordering and review of radiographic studies, pulse oximetry, re-evaluation of patient's condition, obtaining  history from patient or surrogate and review of old charts   Medications Ordered in ED Medications  lactated ringers bolus 1,000 mL (has no administration in time range)  sodium chloride 0.9 % bolus 1,000 mL (1,000 mLs Intravenous Bolus 04/16/21 0834)  ondansetron (ZOFRAN) injection 4 mg (4 mg Intravenous Given 04/16/21 0835)    ED Course  I have reviewed the triage vital signs and the nursing notes.  Pertinent labs & imaging results that were available during my care of the patient were reviewed by me and considered in my medical decision making (see chart for details).    MDM Rules/Calculators/A&P  25 year old female presents the emergency department with suicide attempt.  Patient admits to taking almost an entire full bottle of long-acting iron pills.  The bottle would have held 30 pills, she had been using that bottle for couple days.  Potential maximum ingestion would have been the 30 pills of the 45 mg coming up to 1350 mg.  Poison control has been contacted multiple times.  Her ingestion is well under the 40 mix per keg threshold.  Her EKG is unremarkable.  Her blood work shows no acidosis or acute electrolyte/LFT abnormality. Tylenol and Salicylate is normal. Ethanol 212.  Patient was nauseous with an episode of emesis on arrival but is otherwise been asymptomatic.  Abdominal x-ray does show retained pills.  We did a 4-hour and 8-hour ingestion due to the long-acting nature of the iron pills.  Her 8-hour iron level is 256.  Given her minimal symptoms, low iron level and otherwise unremarkable metabolic work-up Poison control recommends no further aggressive care in regards to this case.  She has been hydrated, will be allowed to eat.  Will be cleared medically for psychiatric evaluation.  Patient is under IVC.  Patient will be signed out to provider Default.   Final Clinical Impression(s) / ED Diagnoses Final diagnoses:  None    Rx / DC Orders ED Discharge  Orders     None        Rozelle Logan, DO 04/16/21 1518

## 2021-04-16 NOTE — ED Notes (Signed)
Pt's mother at bedside.

## 2021-04-16 NOTE — ED Notes (Signed)
Poison Control Contacted.  Recommendations include - hydration therapy with Lactated Ringers 20 ml/kg, Repeat labs in 2-3 hours if labs come back abnormal, abdominal xray and iron level and continuous monitoring.

## 2021-04-16 NOTE — ED Triage Notes (Signed)
Pt BIBA with [facility] Per EMS pt presents with c/o intentional drug overdose with iron tablets. Pt EMS pt called 911 after consuming a large bottle of wine and taking an entire bottle of iron tablets. The bottle contained about 30 tablets with each tablet being 45 mg.   BP 113/69 HR 81 96% room air 198 CBG  18 L AC.

## 2021-04-16 NOTE — ED Notes (Signed)
Pt dressed in burgundy scrubs, belongings collected and placed in cabinets 1-4 and wanded by security.   Belongings include: 1 t-shirt, 1 pair of pants, 1 black cell phone and ID.

## 2021-04-17 DIAGNOSIS — F322 Major depressive disorder, single episode, severe without psychotic features: Secondary | ICD-10-CM | POA: Diagnosis not present
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
## 2021-04-17 NOTE — ED Provider Notes (Signed)
Emergency Medicine Observation Re-evaluation Note  Elizabeth Blake is a 25 y.o. female, seen on rounds today.  Pt initially presented to the ED for complaints of SI and Drug Overdose Currently, the patient is sleeping.  Physical Exam  BP 122/83 (BP Location: Right Arm)   Pulse 67   Temp 98.1 F (36.7 C) (Oral)   Resp 14   Ht 5\' 6"  (1.676 m)   Wt 64 kg   LMP  (LMP Unknown)   SpO2 99%   BMI 22.77 kg/m  Physical Exam General: no distress Lungs: Resp even and unlabored Psych: Sleeping  ED Course / MDM  EKG:EKG Interpretation  Date/Time:  Saturday April 16 2021 08:56:11 EDT Ventricular Rate:  69 PR Interval:  158 QRS Duration: 82 QT Interval:  404 QTC Calculation: 433 R Axis:   92 Text Interpretation: Sinus rhythm Borderline right axis deviation NSR, normal intervals Confirmed by [id number] [phone]) on 04/16/2021 10:19:35 AM  I have reviewed the labs performed to date as well as medications administered while in observation.  Recent changes in the last 24 hours include none.  Plan  Current plan is for inpatient psych admission. 06/16/2021 is under involuntary commitment.      Pablo Ledger, MD 04/17/21 [phone]

## 2021-04-18 ENCOUNTER — Encounter (HOSPITAL_COMMUNITY): Payer: Self-pay | Admitting: Urology

## 2021-04-18 ENCOUNTER — Other Ambulatory Visit: Payer: Self-pay

## 2021-04-18 ENCOUNTER — Inpatient Hospital Stay (HOSPITAL_COMMUNITY)
Admission: AD | Admit: 2021-04-18 | Discharge: 2021-04-21 | DRG: 882 | Disposition: A | Discharge status: Home / Self Care | Payer: 59 | Source: Intra-hospital | Attending: Psychiatry | Admitting: Psychiatry

## 2021-04-18 DIAGNOSIS — T454X2A Poisoning by iron and its compounds, intentional self-harm, initial encounter: Secondary | ICD-10-CM | POA: Diagnosis present

## 2021-04-18 DIAGNOSIS — F4321 Adjustment disorder with depressed mood: Secondary | ICD-10-CM | POA: Diagnosis not present

## 2021-04-18 DIAGNOSIS — Z818 Family history of other mental and behavioral disorders: Secondary | ICD-10-CM | POA: Diagnosis not present

## 2021-04-18 DIAGNOSIS — R7989 Other specified abnormal findings of blood chemistry: Secondary | ICD-10-CM | POA: Diagnosis present

## 2021-04-18 DIAGNOSIS — R61 Generalized hyperhidrosis: Secondary | ICD-10-CM | POA: Diagnosis present

## 2021-04-18 DIAGNOSIS — E785 Hyperlipidemia, unspecified: Secondary | ICD-10-CM | POA: Diagnosis present

## 2021-04-18 DIAGNOSIS — F322 Major depressive disorder, single episode, severe without psychotic features: Secondary | ICD-10-CM | POA: Diagnosis not present

## 2021-04-18 DIAGNOSIS — F4323 Adjustment disorder with mixed anxiety and depressed mood: Secondary | ICD-10-CM | POA: Diagnosis present

## 2021-04-18 DIAGNOSIS — R7401 Elevation of levels of liver transaminase levels: Secondary | ICD-10-CM | POA: Diagnosis not present

## 2021-04-18 LAB — HEMOGLOBIN A1C
Hgb A1c MFr Bld: 5.1 % (ref 4.8–5.6)
Mean Plasma Glucose: 99.67 mg/dL

## 2021-04-18 LAB — LIPID PANEL
Cholesterol: 233 mg/dL — ABNORMAL HIGH (ref 0–200)
HDL: 48 mg/dL (ref >40)
LDL Cholesterol: 147 mg/dL — ABNORMAL HIGH (ref 0–99)
Total CHOL/HDL Ratio: 4.9 RATIO
Triglycerides: 189 mg/dL — ABNORMAL HIGH (ref <150)
VLDL: 38 mg/dL (ref 0–40)

## 2021-04-18 LAB — HCG, QUANTITATIVE, PREGNANCY: hCG, Beta Chain, Quant, S: <1 m[IU]/mL (ref <5)

## 2021-04-18 LAB — TSH: TSH: 2.372 u[IU]/mL (ref 0.350–4.500)

## 2021-04-18 MED ORDER — ACETAMINOPHEN 325 MG PO TABS: 650.0000 mg | ORAL_TABLET | Freq: Four times a day (QID) | ORAL | Status: DC | PRN

## 2021-04-18 MED ORDER — HYDROXYZINE HCL 25 MG PO TABS
25.0000 mg | ORAL_TABLET | Freq: Three times a day (TID) | ORAL | Status: DC | PRN
  Filled 2021-04-18: qty 1

## 2021-04-18 MED ORDER — TRAZODONE HCL 50 MG PO TABS
50.0000 mg | ORAL_TABLET | Freq: Every evening | ORAL | Status: DC | PRN
  Filled 2021-04-18: qty 1

## 2021-04-18 MED ORDER — ALUM & MAG HYDROXIDE-SIMETH 200-200-20 MG/5ML PO SUSP: 30.0000 mL | ORAL | Status: DC | PRN

## 2021-04-18 MED ORDER — MAGNESIUM HYDROXIDE 400 MG/5ML PO SUSP
30.0000 mL | Freq: Every day | ORAL | Status: DC | PRN
Start: 2021-04-18 — End: 2021-04-21

## 2021-04-18 NOTE — ED Notes (Signed)
Assumed care of pt at 0700.  Pt resting in [place].  Comfort needs meet.

## 2021-04-18 NOTE — Progress Notes (Signed)
Patient was invited to attend group. Patient did not attend group. 

## 2021-04-18 NOTE — ED Provider Notes (Signed)
Emergency Medicine Observation Re-evaluation Note  Elizabeth Blake is a 25 y.o. female, seen on rounds today.  Pt initially presented to the ED for complaints of SI and Drug Overdose Currently, the patient is resting comfortably, no complaints, would like to know if she can be discharged.  Physical Exam  BP 137/76   Pulse 64   Temp 98.2 F (36.8 C) (Oral)   Resp 17   Ht 5\' 6"  (1.676 m)   Wt 64 kg   LMP  (LMP Unknown)   SpO2 99%   BMI 22.77 kg/m  Physical Exam General: Awake and alert Lungs: Resp even and unlabored Psych: calm and cooperative  ED Course / MDM  EKG:EKG Interpretation  Date/Time:  Saturday April 16 2021 08:56:11 EDT Ventricular Rate:  69 PR Interval:  158 QRS Duration: 82 QT Interval:  404 QTC Calculation: 433 R Axis:   92 Text Interpretation: Sinus rhythm Borderline right axis deviation NSR, normal intervals Confirmed by [id number] [phone]) on 04/16/2021 10:19:35 AM  I have reviewed the labs performed to date as well as medications administered while in observation.  Recent changes in the last 24 hours include none.  Plan  Current plan is for inpatient psych placement, will communicate to [facility] team that patient would like to go home. [facility] is under involuntary commitment.      Pablo Ledger, MD 04/18/21 [phone]

## 2021-04-18 NOTE — Group Note (Signed)
Occupational Therapy Group Note  Group Topic:Self-Esteem  Group Date: 04/18/2021 Start Time: 1400 End Time: 1450 Facilitators: Ali Mohl, OT/L   Group Description: Group encouraged increased engagement and participation through discussion and activity focused on self-esteem. Patients explored and discussed the differences between healthy and low self-esteem and how it affects our daily lives and occupations with a focus on relationships, work, school, self-care, and personal leisure interests. Group discussion then transitioned into identifying specific strategies to boost self-esteem and engaged in a collaborative and independent activity looking at positive ways to describe oneself A-Z.   Therapeutic Goal(s): Understand and recognize the differences between healthy and low self-esteem Identify healthy strategies to improve/build self-esteem    Participation Level: Did not attend   Plan: Continue to engage patient in OT groups 2 - 3x/week.  04/18/2021  Uriyah Raska, OT/L 

## 2021-04-18 NOTE — ED Notes (Signed)
Report called and IVC sent

## 2021-04-18 NOTE — B[facility] Group Notes (Signed)
LCSW Group Therapy Note   Group Date: 04/18/2021 Start Time: 1300 End Time: 1400     Type of Therapy and Topic:  Group Therapy - How To Cope with Nervousness about Discharge    Participation Level:  Active    Description of Group This process group involved identification of patients' feelings about discharge. Some of them are scheduled to be discharged soon, while others are new admissions, but each of them was asked to share thoughts and feelings surrounding discharge from the hospital. One common theme was that they are excited at the prospect of going home, while another was that many of them are apprehensive about sharing why they were hospitalized. Patients were given the opportunity to discuss these feelings with their peers in preparation for discharge.   Therapeutic Goals   Patient will identify their overall feelings about pending discharge. Patient will think about how they might proactively address issues that they believe will once again arise once they get home (i.e. with parents). Patients will participate in discussion about having hope for change.     Summary of Patient Progress:  Elizabeth Blake stated that after discharge she would like to workout more often. Pt demonstrated insight into the subject matter, and proved open to input from peers and feedback from CSW. Pt was respectful of peers and participated throughout the entire session.     Therapeutic Modalities Cognitive Behavioral Therapy     [name], Theresia Majors 04/18/2021  2:10 PM

## 2021-04-18 NOTE — Progress Notes (Signed)
Pt attended the evening AA group and was appropriate. 

## 2021-04-18 NOTE — Progress Notes (Signed)
Pt accepted to [facility] rm 306-2  Patient meets inpatient criteria per [name], NP.    Dr. Loleta Chance / Dr. Octavia Bruckner is the attending provider.    Call report to [phone]    Megan Salon, RN @ [facility] ED notified via secure chat.     Pt scheduled  to arrive at [facility] at 1130 AM   Signed:  Corky Crafts, MSW, LCSWA, LCASA 04/18/2021 10:12 AM

## 2021-04-18 NOTE — Progress Notes (Signed)
Admission Note: Patient is a 25 year old female admitted to the unit under IVC from [facility] for suicidal ideation, depression and anxiety.  Patient had overdosed on 30 long acting iron pills along with 5 glasses of wine.  Denies SI but stated it was a cry for help.  Verbally contracts for safety while in the hospital.  Reports stressors as loneliness and looking for a job.  Stated she is here to learn coping skills and not to allow things to stress her too much.  Patient is alert and oriented x 4.  Presents with a calm affect and pleasant mood.  Admission plan of care reviewed with consent signed.  Skin and personal belongings completed.  Skin is dry and intact.  No contraband found.  Patient oriented to the unit, staff and room.  Support and encouragement offered as needed.  Verbalizes understanding of unit rules/protocols.  Routine safety checks initiated upon getting to the unit.  Patient is safe on the unit a this time.

## 2021-04-18 NOTE — Progress Notes (Signed)
Pt visible on the unit, pt stated she was feeling better today.     04/18/21 2100  Psych Admission Type (Psych Patients Only)  Admission Status Involuntary  Psychosocial Assessment  Patient Complaints Anxiety;Depression  Eye Contact Brief  Facial Expression Anxious  Affect Anxious  Speech Logical/coherent  Interaction Assertive  Motor Activity Other (Comment)  Appearance/Hygiene In scrubs  Behavior Characteristics Cooperative  Mood Anxious  Thought Process  Coherency WDL  Content WDL  Delusions None reported or observed  Perception WDL  Hallucination None reported or observed  Judgment Impaired  Confusion None  Danger to Self  Current suicidal ideation? Denies  Danger to Others  Danger to Others None reported or observed

## 2021-04-18 NOTE — Tx Team (Signed)
Initial Treatment Plan 04/18/2021 2:42 PM Elizabeth Blake KFE:[id number]    PATIENT STRESSORS: Financial difficulties   Health problems   Marital or family conflict   Occupational concerns     PATIENT STRENGTHS: Ability for insight  Average or above average intelligence  Communication skills  Motivation for treatment/growth  Supportive family/friends    PATIENT IDENTIFIED PROBLEMS: "Not to stress too much"  "Coping skills"  Suicidal ideation  Depression  Anxiety             DISCHARGE CRITERIA:  Ability to meet basic life and health needs Adequate post-discharge living arrangements Motivation to continue treatment in a less acute level of care  PRELIMINARY DISCHARGE PLAN: Attend aftercare/continuing care group Outpatient therapy Return to previous living arrangement  PATIENT/FAMILY INVOLVEMENT: This treatment plan has been presented to and reviewed with the patient, Elizabeth Blake, and/or family member.  The patient and family have been given the opportunity to ask questions and make suggestions.  Audrie Lia Elizabeth Williams, RN 04/18/2021, 2:42 PM

## 2021-04-18 NOTE — ED Notes (Signed)
Unable to DC patient at 1150.  States fluids running.  No fluids running at this time or during the visit.

## 2021-04-18 NOTE — ED Notes (Signed)
Patient was given her breakfast tray.

## 2021-04-19 DIAGNOSIS — F322 Major depressive disorder, single episode, severe without psychotic features: Secondary | ICD-10-CM

## 2021-04-19 NOTE — [facility] Group Notes (Signed)
Adult Psychoeducational Group Note  Date:  04/19/2021 Time:  9:11 PM  Group Topic/Focus:  Wrap-Up Group:   The focus of this group is to help patients review their daily goal of treatment and discuss progress on daily workbooks.  Participation Level:  Active  Participation Quality:  Appropriate and Attentive  Affect:  Appropriate  Cognitive:  Alert and Appropriate  Insight: Appropriate and Good  Engagement in Group:  Engaged  Modes of Intervention:  Discussion and Education  Additional Comments:  Pt attended and participated in wrap up group this evening and rated their day a 7/10. Pt stayed more to themself due to their "social battery being low" from lots of interacting yesterday. When the pt is D/C their goal is to ask for help more when they are overwhelmed and to work out like they used to.   Chrisandra Netters 04/19/2021, 9:11 PM

## 2021-04-19 NOTE — Progress Notes (Signed)
D:  Elizabeth Blake is out and visible on the unit.  She was on the phone much of the evening.  She denied SI/HI or AVH.  No prns medications given this evening.    A:  1:1 with RN for support and encouragement.  Medications as ordered.  Q 15 minute checks maintained for safety.  Encouraged participation in group and unit activities.   R:  She remains safe on the unit.  She is currently resting with her eyes closed and appears to be asleep.    04/19/21 2300  Psych Admission Type (Psych Patients Only)  Admission Status Involuntary  Psychosocial Assessment  Patient Complaints None  Eye Contact Fair  Facial Expression Anxious  Affect Anxious  Speech Logical/coherent  Interaction Assertive  Motor Activity Other (Comment) (unremarkable)  Appearance/Hygiene Unremarkable  Behavior Characteristics Cooperative  Mood Anxious  Thought Process  Coherency WDL  Content WDL  Delusions None reported or observed  Perception WDL  Hallucination None reported or observed  Judgment Poor  Confusion None  Danger to Self  Current suicidal ideation? Denies  Danger to Others  Danger to Others None reported or observed
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
## 2021-04-19 NOTE — [facility] Group Notes (Signed)
Adult Psychoeducational Group Note  Date:  04/19/2021 Time:  10:44 AM  Group Topic/Focus:  Goals Group:   The focus of this group is to help patients establish daily goals to achieve during treatment and discuss how the patient can incorporate goal setting into their daily lives to aide in recovery.  Participation Level:  Active  Participation Quality:  Appropriate and Attentive  Affect:  Appropriate  Cognitive:  Alert and Appropriate  Insight: Appropriate and Good  Engagement in Group:  Engaged  Modes of Intervention:  Discussion  Additional Comments:  Pt attended and participated in the morning goal group. Pt had a goal today to keep adding to her coping skills as well as asking for help.    Elizabeth Blake 04/19/2021, 10:44 AM
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
## 2021-04-19 NOTE — Group Note (Signed)
Recreation Therapy Group Note   Group Topic:Animal Assisted Therapy   Group Date: 04/19/2021 Start Time: 1430 End Time: 1515 Facilitators: Caroll Rancher, LRT/CTRS Location: [address]  [facility]T Program Assumption of Risk Form signed by Patient/ or Parent Legal Guardian Yes  Patient is free of allergies or severe asthma Yes  Patient reports no fear of animals Yes  Patient reports no history of cruelty to animals Yes  Patient understands his/her participation is voluntary Yes  Patient washes hands before animal contact Yes  Patient washes hands after animal contact Yes   Affect/Mood: Appropriate   Participation Level: Engaged   Participation Quality: Independent   Behavior: Appropriate   Speech/Thought Process: Focused   Insight: Good   Judgement: Good   Modes of Intervention: Teaching laboratory technician   Patient Response to Interventions:  Engaged   Education Outcome:  Acknowledges education and In group clarification offered    Clinical Observations/Individualized Feedback: Patient attended session and interacted appropriately with therapy dog and peers. Patient asked appropriate questions about therapy dog and his training. Patient shared stories about their pets at home with group.     Plan: Continue to engage patient in RT group sessions 2-3x/week.   Caroll Rancher, LRT/CTRS 04/19/2021 3:46 PM

## 2021-04-19 NOTE — Progress Notes (Signed)
Pt denies SI/HI/AVH.  Pt denies having depression or anxiety.  Pt disclosed "I don't need any medications."  RN established rapport with pt and assessed for needs and concerns.  Pt pleasant and cooperative this shift.  Pt remains safe with q 15 min room checks in place.

## 2021-04-19 NOTE — [facility] Group Notes (Signed)
Pt did not attend afternoon group. 

## 2021-04-20 ENCOUNTER — Encounter (HOSPITAL_COMMUNITY): Payer: Self-pay

## 2021-04-20 ENCOUNTER — Inpatient Hospital Stay (HOSPITAL_COMMUNITY): Payer: 59

## 2021-04-20 DIAGNOSIS — R7401 Elevation of levels of liver transaminase levels: Secondary | ICD-10-CM

## 2021-04-20 DIAGNOSIS — F4321 Adjustment disorder with depressed mood: Secondary | ICD-10-CM

## 2021-04-20 LAB — COMPREHENSIVE METABOLIC PANEL
ALT: 53 U/L — ABNORMAL HIGH (ref 0–44)
AST: 160 U/L — ABNORMAL HIGH (ref 15–41)
Albumin: 4.2 g/dL (ref 3.5–5.0)
Alkaline Phosphatase: 52 U/L (ref 38–126)
Anion gap: 6 (ref 5–15)
BUN: 12 mg/dL (ref 6–20)
CO2: 28 mmol/L (ref 22–32)
Calcium: 9.7 mg/dL (ref 8.9–10.3)
Chloride: 107 mmol/L (ref 98–111)
Creatinine, Ser: 0.75 mg/dL (ref 0.44–1.00)
GFR, Estimated: >60 mL/min (ref >60)
Glucose, Bld: 98 mg/dL (ref 70–99)
Potassium: 4.1 mmol/L (ref 3.5–5.1)
Sodium: 141 mmol/L (ref 135–145)
Total Bilirubin: 0.5 mg/dL (ref 0.3–1.2)
Total Protein: 7.4 g/dL (ref 6.5–8.1)

## 2021-04-20 LAB — CBC
HCT: 38.5 % (ref 36.0–46.0)
Hemoglobin: 12.2 g/dL (ref 12.0–15.0)
MCH: 28 pg (ref 26.0–34.0)
MCHC: 31.7 g/dL (ref 30.0–36.0)
MCV: 88.5 fL (ref 80.0–100.0)
Platelets: 259 10*3/uL (ref 150–400)
RBC: 4.35 MIL/uL (ref 3.87–5.11)
RDW: 12.8 % (ref 11.5–15.5)
WBC: 8.7 10*3/uL (ref 4.0–10.5)
nRBC: 0 % (ref 0.0–0.2)

## 2021-04-20 LAB — PROTIME-INR
INR: 1 (ref 0.8–1.2)
Prothrombin Time: 12.8 seconds (ref 11.4–15.2)

## 2021-04-20 LAB — LIPASE, BLOOD: Lipase: 31 U/L (ref 11–51)

## 2021-04-20 LAB — IRON: Iron: 72 ug/dL (ref 28–170)

## 2021-04-20 NOTE — Plan of Care (Signed)
  Problem: Safety: Goal: Periods of time without injury will increase Outcome: Progressing   Problem: Education: Goal: Knowledge of the prescribed therapeutic regimen will improve Outcome: Progressing   Problem: Activity: Goal: Interest or engagement in leisure activities will improve Outcome: Progressing   

## 2021-04-20 NOTE — [facility] Group Notes (Signed)
Adult Psychoeducational Group Note  Date:  04/20/2021 Time:  4:38 PM  Group Topic/Focus:  Managing Feelings:   The focus of this group is to identify what feelings patients have difficulty handling and develop a plan to handle them in a healthier way upon discharge.  Participation Level:  Active  Participation Quality:  Attentive  Affect:  Appropriate  Cognitive:  Alert  Insight: Appropriate  Engagement in Group:  Engaged  Modes of Intervention:  Discussion  Additional Comments:  Patient attend and participated in the Psycho-Ed group.  Jearl Klinefelter 04/20/2021, 4:38 PM

## 2021-04-20 NOTE — Group Note (Signed)
LCSW Group Therapy Note  Group Date: 04/20/2021 Start Time: 1300 End Time: 1400   Type of Therapy and Topic:  Group Therapy: Using "I" Statements  Participation Level:  Active  Description of Group:  Patients were asked to provide details of some interpersonal conflicts they have experienced. Patients were then educated about "I" statements, communication which focuses on feelings or views of the speaker rather than what the other person is doing. T group members were asked to reflect on past conflicts and to provide specific examples for utilizing "I" statements.  Therapeutic Goals:  Patients will verbalize understanding of ineffective communication and effective communication. Patients will be able to empathize with whom they are having conflict. Patients will practice effective communication in the form of "I" statements.    Summary of Patient Progress:  Elizabeth Blake shared that miscommunication is easy to happen with other people. The patient was present/active throughout the session and proved open to feedback from CSW and peers. Patient demonstrated insight into the subject matter, was respectful of peers, and was present throughout the entire session.  Therapeutic Modalities:   Cognitive Behavioral Therapy Solution-Focused Therapy    [name], LCSW 04/20/2021  2:03 PM

## 2021-04-20 NOTE — Progress Notes (Signed)
Progress note  Pt found in bed. Pt was pleasant and animated. Pt expressed want for discharge. Pt was switched to voluntarily. Pt signed a 72h request for discharge. Pt states they miss their family and they feel they are ready to return home. Pt denies si/hi/ah/vh and verbally agrees to approach staff if these become apparent or before harming themselves/others while at [facility].  A: Pt provided support and encouragement. Pt given medication per protocol and standing orders. Q87m safety checks implemented and continued.  R: Pt safe on the unit. Will continue to monitor.
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
## 2021-04-20 NOTE — Group Note (Signed)
Recreation Therapy Group Note   Group Topic:Stress Management  Group Date: 04/20/2021 Start Time: 0930 End Time: 0950 Facilitators: Caroll Rancher, LRT/CTRS Location: [address] Dayroom  Goal Area(s) Addresses:  Patient will actively participate in stress management techniques presented during session.  Patient will successfully identify benefit of practicing stress management post d/c.    Group Description:  LRT explained the stress management technique of meditation. LRT proceeded to play a meditation focused on self forgiveness.  Patient was asked to participate in the technique introduced during session.  Patients were given suggestions of ways to access scripts post d/c and encouraged to explore Youtube and other apps available on smartphones, tablets, and computers.   Affect/Mood: N/A   Participation Level: Did not attend    Clinical Observations/Individualized Feedback: Pt did not attend group.    Plan: Continue to engage patient in RT group sessions 2-3x/week.   Caroll Rancher, LRT/CTRS  04/20/2021 12:28 PM

## 2021-04-20 NOTE — [facility] Group Notes (Signed)
[facility] Group Notes:  (Nursing/MHT/Case Management/Adjunct)  Date:  04/20/2021  Time:  9:02 PM  Type of Therapy:   wrap up  Participation Level:  Active  Participation Quality:  Appropriate and Attentive  Affect:  Appropriate  Cognitive:  Appropriate  Insight:  Appropriate and Good  Engagement in Group:  Developing/Improving  Modes of Intervention:  Discussion  Summary of Progress/Problems:PT was very calm and peasant. PT wants to work on her discharge plan.   Lorita Officer 04/20/2021, 9:02 PM

## 2021-04-20 NOTE — Progress Notes (Signed)
[facility] MD Progress Note  04/20/2021 1:42 PM Elizabeth Blake  MRN:  [id number] Subjective:   [name] is a 25 yr old female with no psychiatric history was admitted to the psychiatric unit after suicide attempt via overdose.  Psychiatric Team made the following recommendations yesterday: -Will not start medications   Case was discussed in the multidisciplinary team.     On interview today patient reports that her mood is great.  She reports that her sleep was good.  She reports that her appetite was good.  She reports no SI, HI, or AVH.  She reports that today she plans to be more interactive and involved in groups today.  She reports that yesterday she stayed in her room because the day before she was very interactive with others.    She states that she is still not interested in starting medications.  She states that she does want to start therapy once discharged but since she is self pay needs some who will take self pay.  Discussed that social work would be able to provide her with therapists who would be able to see her.  Discussed with her that her lab work this morning showed an increase in her Liver Enzymes.  Discussed that this could be due to her iron supplement overdose.  Discussed that we would consult the hospitalist service for recommendations.  Discussed that this could effect her discharge but would not know until these recommendations are made.  She reported understanding.  She reports no other concerns at present.   Principal Problem: Adjustment disorder with depressed mood Diagnosis: Principal Problem:   Adjustment disorder with depressed mood  Total Time spent with patient: 30 minutes  Past Psychiatric History: Reports none  Past Medical History:  Past Medical History:  Diagnosis Date   Depression    Hypertension    UTI (urinary tract infection)    History reviewed. No pertinent surgical history. Family History:  Family History  Problem Relation Age of  Onset   Miscarriages / Stillbirths Mother    Hypertension Father    Cancer Maternal Grandmother    Diabetes Maternal Grandmother    Family Psychiatric  History: Sister- Depression and PTSD Social History:  Social History   Substance and Sexual Activity  Alcohol Use Yes     Social History   Substance and Sexual Activity  Drug Use Never    Social History   Socioeconomic History   Marital status: Single    Spouse name: Not on file   Number of children: Not on file   Years of education: Not on file   Highest education level: Not on file  Occupational History   Not on file  Tobacco Use   Smoking status: Never   Smokeless tobacco: Never  Vaping Use   Vaping Use: Never used  Substance and Sexual Activity   Alcohol use: Yes   Drug use: Never   Sexual activity: Yes  Other Topics Concern   Not on file  Social History Narrative   Not on file   Social Determinants of Health   Financial Resource Strain: Not on file  Food Insecurity: Not on file  Transportation Needs: Not on file  Physical Activity: Not on file  Stress: Not on file  Social Connections: Not on file   Additional Social History:                         Sleep: Good  Appetite:  Good  Current Medications: Current Facility-Administered Medications  Medication Dose Route Frequency Provider Last Rate Last Admin   alum & mag hydroxide-simeth (MAALOX/MYLANTA) 200-200-20 MG/5ML suspension 30 mL  30 mL Oral Q4H PRN Laveda Abbe, NP       hydrOXYzine (ATARAX/VISTARIL) tablet 25 mg  25 mg Oral TID PRN Laveda Abbe, NP       magnesium hydroxide (MILK OF MAGNESIA) suspension 30 mL  30 mL Oral Daily PRN Laveda Abbe, NP       traZODone (DESYREL) tablet 50 mg  50 mg Oral QHS PRN Laveda Abbe, NP        Lab Results:  Results for orders placed or performed during the hospital encounter of 04/18/21 (from the past 48 hour(s))  TSH     Status: None   Collection Time:  04/18/21  6:28 PM  Result Value Ref Range   TSH 2.372 0.350 - 4.500 uIU/mL    Comment: Performed by a 3rd Generation assay with a functional sensitivity of <=0.01 uIU/mL. Performed at [facility], [address], [place], [place] [address]   Lipid panel     Status: Abnormal   Collection Time: 04/18/21  6:28 PM  Result Value Ref Range   Cholesterol 233 (H) 0 - 200 mg/dL   Triglycerides 850 (H) <150 mg/dL   HDL 48 >27 mg/dL   Total CHOL/HDL Ratio 4.9 RATIO   VLDL 38 0 - 40 mg/dL   LDL Cholesterol 741 (H) 0 - 99 mg/dL    Comment:        Total Cholesterol/HDL:CHD Risk Coronary Heart Disease Risk Table                     Men   Women  1/2 Average Risk   3.4   3.3  Average Risk       5.0   4.4  2 X Average Risk   9.6   7.1  3 X Average Risk  23.4   11.0        Use the calculated Patient Ratio above and the CHD Risk Table to determine the patient's CHD Risk.        ATP III CLASSIFICATION (LDL):  <100     mg/dL   Optimal  287-867  mg/dL   Near or Above                    Optimal  130-159  mg/dL   Borderline  672-094  mg/dL   High  >709     mg/dL   Very High Performed at [facility], [address] [place], [place] [address]   Hemoglobin A1c     Status: None   Collection Time: 04/18/21  6:28 PM  Result Value Ref Range   Hgb A1c MFr Bld 5.1 4.8 - 5.6 %    Comment: (NOTE) Pre diabetes:          5.7%-6.4%  Diabetes:              >6.4%  Glycemic control for   <7.0% adults with diabetes    Mean Plasma Glucose 99.67 mg/dL    Comment: Performed at [facility], [address] [place], [place] [address]  hCG, quantitative, pregnancy     Status: None   Collection Time: 04/18/21  6:28 PM  Result Value Ref Range   hCG, Beta Chain, Quant, S <1 <5 mIU/mL    Comment:  GEST. AGE      CONC.  (mIU/mL)   <=1 WEEK        5 - 50     2 WEEKS       50 - 500     3 WEEKS       100 - 10,000     4 WEEKS     1,000 - 30,000     5 WEEKS      3,500 - 115,000   6-8 WEEKS     12,000 - 270,000    12 WEEKS     15,000 - 220,000        FEMALE AND NON-PREGNANT FEMALE:     LESS THAN 5 mIU/mL Performed at [facility], [address], [place], [place] [address]   Iron     Status: None   Collection Time: 04/20/21  6:34 AM  Result Value Ref Range   Iron 72 28 - 170 ug/dL    Comment: Performed at [facility] [address] [place], [place] [address]  Comprehensive metabolic panel     Status: Abnormal   Collection Time: 04/20/21  6:34 AM  Result Value Ref Range   Sodium 141 135 - 145 mmol/L   Potassium 4.1 3.5 - 5.1 mmol/L   Chloride 107 98 - 111 mmol/L   CO2 28 22 - 32 mmol/L   Glucose, Bld 98 70 - 99 mg/dL    Comment: Glucose reference range applies only to samples taken after fasting for at least 8 hours.   BUN 12 6 - 20 mg/dL   Creatinine, Ser 8.65 0.44 - 1.00 mg/dL   Calcium 9.7 8.9 - 78.4 mg/dL   Total Protein 7.4 6.5 - 8.1 g/dL   Albumin 4.2 3.5 - 5.0 g/dL   AST 696 (H) 15 - 41 U/L   ALT 53 (H) 0 - 44 U/L   Alkaline Phosphatase 52 38 - 126 U/L   Total Bilirubin 0.5 0.3 - 1.2 mg/dL   GFR, Estimated >29 >52 mL/min    Comment: (NOTE) Calculated using the CKD-EPI Creatinine Equation (2021)    Anion gap 6 5 - 15    Comment: Performed at [facility], [address], [place], [place] [address]  CBC     Status: None   Collection Time: 04/20/21  6:34 AM  Result Value Ref Range   WBC 8.7 4.0 - 10.5 K/uL   RBC 4.35 3.87 - 5.11 MIL/uL   Hemoglobin 12.2 12.0 - 15.0 g/dL   HCT 44.0 10.2 - 72.5 %   MCV 88.5 80.0 - 100.0 fL   MCH 28.0 26.0 - 34.0 pg   MCHC 31.7 30.0 - 36.0 g/dL   RDW 36.6 44.0 - 34.7 %   Platelets 259 150 - 400 K/uL   nRBC 0.0 0.0 - 0.2 %    Comment: Performed at [facility], [address] [place], [place] [address]  Lipase, blood     Status: None   Collection Time: 04/20/21  6:34 AM  Result Value Ref Range   Lipase 31 11 -  51 U/L    Comment: Performed at [facility], [address] [place], [place] [address]    Blood Alcohol level:  Lab Results  Component Value Date   ETH 212 (H) 04/16/2021   ETH 130 (H) 02/14/2019    Metabolic Disorder Labs: Lab Results  Component Value Date   HGBA1C 5.1 04/18/2021   MPG  99.67 04/18/2021   No results found for: PROLACTIN Lab Results  Component Value Date   CHOL 233 (H) 04/18/2021   TRIG 189 (H) 04/18/2021   HDL 48 04/18/2021   CHOLHDL 4.9 04/18/2021   VLDL 38 04/18/2021   LDLCALC 147 (H) 04/18/2021    Physical Findings:  Musculoskeletal: Strength & Muscle Tone: within normal limits Gait & Station: normal Patient leans: N/A  Psychiatric Specialty Exam:  Presentation  General Appearance: Appropriate for Environment; Well Groomed  Eye Contact:Good  Speech:Clear and Coherent; Normal Rate  Speech Volume:Normal  Handedness: No data recorded  Mood and Affect  Mood:Euthymic  Affect:Appropriate; Congruent   Thought Process  Thought Processes:Coherent; Linear  Descriptions of Associations:Intact  Orientation:Full (Time, Place and Person)  Thought Content:Logical; WDL  History of Schizophrenia/Schizoaffective disorder:No  Duration of Psychotic Symptoms:No data recorded Hallucinations:Hallucinations: None  Ideas of Reference:None  Suicidal Thoughts:Suicidal Thoughts: No  Homicidal Thoughts:Homicidal Thoughts: No   Sensorium  Memory:Immediate Good; Recent Good  Judgment:Fair  Insight:Fair   Executive Functions  Concentration:Good  Attention Span:Good  Recall:Good  Fund of Knowledge: Good Language: Good  Psychomotor Activity  Psychomotor Activity:Psychomotor Activity: Normal   Assets  Assets: Communication Skills; Physical Health; Desire for Improvement  Sleep  Sleep:Sleep: Good Number of Hours of Sleep: 6.75    Physical Exam: Physical Exam Vitals and nursing note reviewed.   Constitutional:      General: She is not in acute distress.    Appearance: Normal appearance. She is normal weight. She is not ill-appearing or toxic-appearing.  HENT:     Head: Normocephalic and atraumatic.  Pulmonary:     Effort: Pulmonary effort is normal.  Musculoskeletal:        General: Normal range of motion.  Neurological:     General: No focal deficit present.     Mental Status: She is alert.   Review of Systems  Respiratory:  Negative for cough and shortness of breath.   Cardiovascular:  Negative for chest pain.  Gastrointestinal:  Negative for abdominal pain, constipation, diarrhea, nausea and vomiting.  Neurological:  Negative for weakness and headaches.  Psychiatric/Behavioral:  Negative for depression, hallucinations and suicidal ideas. The patient is not nervous/anxious.   Blood pressure 126/87, pulse 90, temperature 98.2 F (36.8 C), temperature source Oral, resp. rate 18, height 5\' 6"  (1.676 m), weight 72.6 kg, SpO2 100 %. Body mass index is 25.82 kg/m.   Treatment Plan Summary: Daily contact with patient to assess and evaluate symptoms and progress in treatment  Mrs. is a 25 yr old female with no psychiatric history was admitted to the psychiatric unit after suicide attempt via overdose.   She is doing well from a psychiatric standpoint.  Consulted Hospitalist Service for recommendations and Dr. 34 will see her and make recommendations.  Pending the recommendations from Hospitalist will plan for discharge tomorrow.  We will continue to monitor.    Adjustment Disorder: -Does not want medication at this time -Wants to start psychotherapy   -Continue PRN's: Maalox, Atarax, Milk of Magnesia, Trazodone   Ronaldo Miyamoto, MD 04/20/2021, 1:42 PM

## 2021-04-20 NOTE — [facility] Group Notes (Addendum)
Adult Psychoeducational Group Note  Date:  04/20/2021 Time:  11:02 AM  Group Topic/Focus:  Goals Group:   The focus of this group is to help patients establish daily goals to achieve during treatment and discuss how the patient can incorporate goal setting into their daily lives to aide in recovery.  Participation Level:  Active  Participation Quality:  Appropriate  Affect:  Appropriate  Modes of Intervention:  Discussion  Additional Comments:  Pt has a goal today of socializing more and not overthinking situations.   Elizabeth Blake 04/20/2021, 11:02 AM

## 2021-04-20 NOTE — BH IP Treatment Plan (Signed)
Interdisciplinary Treatment and Diagnostic Plan Update  04/20/2021 Time of Session: 9:15am  Kajsa Butrum MRN: [id number]  Principal Diagnosis: Adjustment disorder with depressed mood  Secondary Diagnoses: Principal Problem:   Adjustment disorder with depressed mood   Current Medications:  Current Facility-Administered Medications  Medication Dose Route Frequency Provider Last Rate Last Admin   alum & mag hydroxide-simeth (MAALOX/MYLANTA) 200-200-20 MG/5ML suspension 30 mL  30 mL Oral Q4H PRN Laveda Abbe, NP       hydrOXYzine (ATARAX/VISTARIL) tablet 25 mg  25 mg Oral TID PRN Laveda Abbe, NP       magnesium hydroxide (MILK OF MAGNESIA) suspension 30 mL  30 mL Oral Daily PRN Laveda Abbe, NP       traZODone (DESYREL) tablet 50 mg  50 mg Oral QHS PRN Laveda Abbe, NP       PTA Medications: Medications Prior to Admission  Medication Sig Dispense Refill Last Dose   clobetasol cream (TEMOVATE) 0.05 % Apply 1 application topically 2 (two) times daily. (Patient not taking: No sig reported)      clobetasol cream (TEMOVATE) 0.05 % Apply 1 application topically in the morning and at bedtime. (Patient not taking: No sig reported)      Ferrous Sulfate (IRON) 142 (45 Fe) MG TBCR Take 45 mg by mouth daily after breakfast.      ketoconazole (NIZORAL) 2 % cream Apply 1 application topically daily. (Patient not taking: No sig reported)      medroxyPROGESTERone (DEPO-PROVERA) 150 MG/ML injection Inject 150 mg into the muscle every 3 (three) months.      nitrofurantoin, macrocrystal-monohydrate, (MACROBID) 100 MG capsule Take 100 mg by mouth 2 (two) times daily. (Patient not taking: No sig reported)       Patient Stressors: Financial difficulties   Health problems   Marital or family conflict   Occupational concerns    Patient Strengths: Ability for insight  Average or above average intelligence  Communication skills  Motivation for treatment/growth   Supportive family/friends   Treatment Modalities: Medication Management, Group therapy, Case management,  1 to 1 session with clinician, Psychoeducation, Recreational therapy.   Physician Treatment Plan for Primary Diagnosis: Adjustment disorder with depressed mood Long Term Goal(s): Improvement in symptoms so as ready for discharge   Short Term Goals: Ability to identify changes in lifestyle to reduce recurrence of condition will improve Ability to verbalize feelings will improve Ability to disclose and discuss suicidal ideas Ability to demonstrate self-control will improve Ability to identify and develop effective coping behaviors will improve Ability to maintain clinical measurements within normal limits will improve Compliance with prescribed medications will improve Ability to identify triggers associated with substance abuse/mental health issues will improve  Medication Management: Evaluate patient's response, side effects, and tolerance of medication regimen.  Therapeutic Interventions: 1 to 1 sessions, Unit Group sessions and Medication administration.  Evaluation of Outcomes: Progressing  Physician Treatment Plan for Secondary Diagnosis: Principal Problem:   Adjustment disorder with depressed mood  Long Term Goal(s): Improvement in symptoms so as ready for discharge   Short Term Goals: Ability to identify changes in lifestyle to reduce recurrence of condition will improve Ability to verbalize feelings will improve Ability to disclose and discuss suicidal ideas Ability to demonstrate self-control will improve Ability to identify and develop effective coping behaviors will improve Ability to maintain clinical measurements within normal limits will improve Compliance with prescribed medications will improve Ability to identify triggers associated with substance abuse/mental health issues will improve  Medication Management: Evaluate patient's response, side effects,  and tolerance of medication regimen.  Therapeutic Interventions: 1 to 1 sessions, Unit Group sessions and Medication administration.  Evaluation of Outcomes: Progressing   RN Treatment Plan for Primary Diagnosis: Adjustment disorder with depressed mood Long Term Goal(s): Knowledge of disease and therapeutic regimen to maintain health will improve  Short Term Goals: Ability to remain free from injury will improve, Ability to participate in decision making will improve, Ability to verbalize feelings will improve, Ability to disclose and discuss suicidal ideas, and Ability to identify and develop effective coping behaviors will improve  Medication Management: RN will administer medications as ordered by provider, will assess and evaluate patient's response and provide education to patient for prescribed medication. RN will report any adverse and/or side effects to prescribing provider.  Therapeutic Interventions: 1 on 1 counseling sessions, Psychoeducation, Medication administration, Evaluate responses to treatment, Monitor vital signs and CBGs as ordered, Perform/monitor CIWA, COWS, AIMS and Fall Risk screenings as ordered, Perform wound care treatments as ordered.  Evaluation of Outcomes: Progressing   LCSW Treatment Plan for Primary Diagnosis: Adjustment disorder with depressed mood Long Term Goal(s): Safe transition to appropriate next level of care at discharge, Engage patient in therapeutic group addressing interpersonal concerns.  Short Term Goals: Engage patient in aftercare planning with referrals and resources, Increase social support, Increase emotional regulation, Facilitate acceptance of mental health diagnosis and concerns, Identify triggers associated with mental health/substance abuse issues, and Increase skills for wellness and recovery  Therapeutic Interventions: Assess for all discharge needs, 1 to 1 time with Social worker, Explore available resources and support systems,  Assess for adequacy in community support network, Educate family and significant other(s) on suicide prevention, Complete Psychosocial Assessment, Interpersonal group therapy.  Evaluation of Outcomes: Progressing   Progress in Treatment: Attending groups: Yes. Participating in groups: Yes. Taking medication as prescribed: Yes. Toleration medication: Yes. Family/Significant other contact made: Yes, individual(s) contacted:  Husband  Patient understands diagnosis: Yes. Discussing patient identified problems/goals with staff: Yes. Medical problems stabilized or resolved: Yes. Denies suicidal/homicidal ideation: Yes. Issues/concerns per patient self-inventory: No.   New problem(s) identified: No, Describe:  None   New Short Term/Long Term Goal(s): medication stabilization, elimination of SI thoughts, development of comprehensive mental wellness plan.   Patient Goals: "To learn coping skills and improve myself"   Discharge Plan or Barriers: Patient recently admitted. CSW will continue to follow and assess for appropriate referrals and possible discharge planning.   Reason for Continuation of Hospitalization: Depression Medication stabilization Suicidal ideation  Estimated Length of Stay: 3 to 5 days    Scribe for Treatment Team: [name], Theresia Majors 04/20/2021 2:51 PM

## 2021-04-20 NOTE — BHH Group Notes (Signed)
Adult Psychoeducational Group Note  Date:  04/20/2021 Time:  11:36 AM  Group Topic/Focus:  Personal Choices and Values:   The focus of this group is to help patients assess and explore the importance of values in their lives, how their values affect their decisions, how they express their values and what opposes their expression.  Participation Level:  Active  Participation Quality:  Appropriate and Attentive  Affect:  Appropriate  Cognitive:  Alert and Appropriate  Insight: Appropriate and Good  Engagement in Group:  Engaged  Modes of Intervention:  Discussion  Margaret Pyle 04/20/2021, 11:36 AM

## 2021-04-21 DIAGNOSIS — R7989 Other specified abnormal findings of blood chemistry: Secondary | ICD-10-CM

## 2021-04-21 LAB — COMPREHENSIVE METABOLIC PANEL
ALT: 52 U/L — ABNORMAL HIGH (ref 0–44)
AST: 122 U/L — ABNORMAL HIGH (ref 15–41)
Albumin: 4.5 g/dL (ref 3.5–5.0)
Alkaline Phosphatase: 57 U/L (ref 38–126)
Anion gap: 8 (ref 5–15)
BUN: 11 mg/dL (ref 6–20)
CO2: 26 mmol/L (ref 22–32)
Calcium: 10.2 mg/dL (ref 8.9–10.3)
Chloride: 111 mmol/L (ref 98–111)
Creatinine, Ser: 0.83 mg/dL (ref 0.44–1.00)
GFR, Estimated: >60 mL/min (ref >60)
Glucose, Bld: 101 mg/dL — ABNORMAL HIGH (ref 70–99)
Potassium: 4.3 mmol/L (ref 3.5–5.1)
Sodium: 145 mmol/L (ref 135–145)
Total Bilirubin: 0.3 mg/dL (ref 0.3–1.2)
Total Protein: 7.6 g/dL (ref 6.5–8.1)

## 2021-04-21 NOTE — Discharge Summary (Signed)
Physician Discharge Summary Note  Patient:  Elizabeth Blake is an 25 y.o., female MRN:  [id number] DOB:  09-Sep-1995 Patient phone:  [phone] (home)  Patient address:   [address] [place] [address],  Total Time spent with patient: 45 minutes  Date of Admission:  04/18/2021 Date of Discharge: 04/21/2021  Reason for Admission:   Elizabeth Blake is a 25 yr old female with no psychiatric history was admitted to the psychiatric unit after suicide attempt via overdose.  She reported multiple life stressors making her feel more anxious, overwhelmed, and down for about 1 week.  She reports difficulty balancing work, her kids schedules,  a new marriage (2 months), and coordinating time off with her job for her kids. She reported her overdose was intentionally non-lethal and a "cry for help." She reported immediate regret and remorse and calling EMS.    Hospital Course:   During hospitalization, patient was evaluated by the psychiatric team.  They received multiple disciplinary care.   After initial intake evaluation, it was decided to not start medications. She was interested in therapy and as this is the first line treatment for adjustment disorder this was pursued. Patient received supportive psychotherapy and was encouraged to participate in group therapy during the hospitalization. She interacted well with other patients on the unit and actively participated in groups. Patient denied having suicidal thoughts more than 48 hours prior to discharge.    On day of discharge she reports that she is doing good.  She reports that she slept well last night.   She reports that her appetite is good.  She reports no SI, HI, or AVH.  Discussed that the [place] she got yesterday showed no abnormalities with her liver.  Discussed that her repeat CMP this morning showed her AST/ALT where down trending.  Discussed that we would await final recommendations from the Hospitalist but she would  most likely be discharging today.  She was excited to be discharging and reported hoping there would be no need for further inpatient workup.   Discussed what she would do in the future if she had another crisis.  She reports she will talk to her therapist or family.  Agreed with this but also re-enforced that she could return to [facility], go to [facility], go to the nearest ED, or call 911 or [phone].  She reported understanding.  She reports no other concerns at present.  Hospitalist recommended patient follow up with her PCP in one week for a repeat Liver Function to ensure that AST/ALT continue to downtrend towards normal.  She was discharged home.    Principal Problem: Adjustment disorder with depressed mood Discharge Diagnoses: Principal Problem:   Adjustment disorder with depressed mood   Past Psychiatric History: Reports none  Past Medical History:  Past Medical History:  Diagnosis Date   Depression    Hypertension    UTI (urinary tract infection)    History reviewed. No pertinent surgical history. Family History:  Family History  Problem Relation Age of Onset   Miscarriages / Stillbirths Mother    Hypertension Father    Cancer Maternal Grandmother    Diabetes Maternal Grandmother    Family Psychiatric  History: Sister- Depression and PTSD Social History:  Social History   Substance and Sexual Activity  Alcohol Use Yes     Social History   Substance and Sexual Activity  Drug Use Never    Social History   Socioeconomic History   Marital status: Single  Spouse name: Not on file   Number of children: Not on file   Years of education: Not on file   Highest education level: Not on file  Occupational History   Not on file  Tobacco Use   Smoking status: Never   Smokeless tobacco: Never  Vaping Use   Vaping Use: Never used  Substance and Sexual Activity   Alcohol use: Yes   Drug use: Never   Sexual activity: Yes  Other Topics Concern   Not on file  Social History  Narrative   Not on file   Social Determinants of Health   Financial Resource Strain: Not on file  Food Insecurity: Not on file  Transportation Needs: Not on file  Physical Activity: Not on file  Stress: Not on file  Social Connections: Not on file    Physical Findings:   Musculoskeletal: Strength & Muscle Tone: within normal limits Gait & Station: normal Patient leans: N/A   Psychiatric Specialty Exam:  Presentation  General Appearance: Appropriate for Environment; Casual; Fairly Groomed  Eye Contact:Good  Speech:Clear and Coherent; Normal Rate  Speech Volume:Normal  Handedness: No data recorded  Mood and Affect  Mood:Euthymic  Affect:Appropriate; Congruent   Thought Process  Thought Processes:Coherent  Descriptions of Associations:Intact  Orientation:Full (Time, Place and Person)  Thought Content:Logical  History of Schizophrenia/Schizoaffective disorder:No  Duration of Psychotic Symptoms:No data recorded Hallucinations:Hallucinations: None  Ideas of Reference:None  Suicidal Thoughts:Suicidal Thoughts: No  Homicidal Thoughts:Homicidal Thoughts: No   Sensorium  Memory:Immediate Good; Recent Good  Judgment:Good  Insight:Good   Executive Functions  Concentration:Good  Attention Span:Good  Recall:Good  Fund of Knowledge:Good  Language:Good   Psychomotor Activity  Psychomotor Activity:Psychomotor Activity: Normal   Assets  Assets:Communication Skills; Physical Health; Resilience; Desire for Improvement   Sleep  Sleep:Sleep: Good Number of Hours of Sleep: 6.25    Physical Exam: Physical Exam Vitals and nursing note reviewed.  Constitutional:      General: She is not in acute distress.    Appearance: Normal appearance. She is normal weight. She is not ill-appearing or toxic-appearing.  HENT:     Head: Normocephalic and atraumatic.  Pulmonary:     Effort: Pulmonary effort is normal.  Musculoskeletal:        General:  Normal range of motion.  Neurological:     General: No focal deficit present.     Mental Status: She is alert.   Review of Systems  Respiratory:  Negative for cough and shortness of breath.   Cardiovascular:  Negative for chest pain.  Gastrointestinal:  Negative for abdominal pain, constipation, diarrhea, nausea and vomiting.  Neurological:  Negative for weakness and headaches.  Psychiatric/Behavioral:  Negative for depression, hallucinations and suicidal ideas. The patient is not nervous/anxious.   Blood pressure 126/87, pulse 90, temperature 98.2 F (36.8 C), temperature source Oral, resp. rate 18, height 5\' 6"  (1.676 m), weight 72.6 kg, SpO2 100 %. Body mass index is 25.82 kg/m.   Social History   Tobacco Use  Smoking Status Never  Smokeless Tobacco Never   Tobacco Cessation:  N/A, patient does not currently use tobacco products   Blood Alcohol level:  Lab Results  Component Value Date   ETH 212 (H) 04/16/2021   ETH 130 (H) 02/14/2019    Metabolic Disorder Labs:  Lab Results  Component Value Date   HGBA1C 5.1 04/18/2021   MPG 99.67 04/18/2021   No results found for: PROLACTIN Lab Results  Component Value Date   CHOL 233 (H)  04/18/2021   TRIG 189 (H) 04/18/2021   HDL 48 04/18/2021   CHOLHDL 4.9 04/18/2021   VLDL 38 04/18/2021   LDLCALC 147 (H) 04/18/2021    See Psychiatric Specialty Exam and Suicide Risk Assessment completed by Attending Physician prior to discharge.  Discharge destination:  Home  Is patient on multiple antipsychotic therapies at discharge:  No   Has Patient had three or more failed trials of antipsychotic monotherapy by history:  No  Recommended Plan for Multiple Antipsychotic Therapies: NA   Prescriptions given at discharge. Patient agreeable to plan. Given opportunity to ask questions. Appears to feel comfortable with discharge denies any current suicidal or homicidal thought.  Patient is also instructed prior to discharge to: Take  all medications as prescribed by mental healthcare provider. Report any adverse effects and or reactions from the medicines to outpatient provider promptly. Patient has been instructed & cautioned: To not engage in alcohol and or illegal drug use while on prescription medicines. In the event of worsening symptoms,  patient is instructed to call the crisis hotline, 911 and or go to the nearest ED for appropriate evaluation and treatment of symptoms. To follow-up with primary care provider for other medical issues, concerns and or health care needs  The patient was evaluated each day by a clinical provider to ascertain response to treatment. Improvement was noted by the patient's report of decreasing symptoms, improved sleep and appetite, affect, medication tolerance, behavior, and participation in unit programming.  Patient was asked each day to complete a self inventory noting mood, mental status, pain, new symptoms, anxiety and concerns.  Patient responded well to medication and being in a therapeutic and supportive environment. Positive and appropriate behavior was noted and the patient was motivated for recovery. The patient worked closely with the treatment team and case manager to develop a discharge plan with appropriate goals. Coping skills, problem solving as well as relaxation therapies were also part of the unit programming.  By the day of discharge patient was in much improved condition than upon admission.  Symptoms were reported as significantly decreased or resolved completely. The patient denied SI/HI and voiced no AVH. The patient was motivated to continue taking medication with a goal of continued improvement in mental health.   Patient was discharged home with a plan to follow up as noted below.   Discharge Instructions     Diet - low sodium heart healthy   Complete by: As directed    Increase activity slowly   Complete by: As directed       Allergies as of 04/21/2021   No  Known Allergies      Medication List     STOP taking these medications    clobetasol cream 0.05 % Commonly known as: TEMOVATE   Iron 142 (45 Fe) MG Tbcr   ketoconazole 2 % cream Commonly known as: NIZORAL   medroxyPROGESTERone 150 MG/ML injection Commonly known as: DEPO-PROVERA   nitrofurantoin (macrocrystal-monohydrate) 100 MG capsule Commonly known as: MACROBID        Follow-up Information     [facility] Follow up.   Specialty: Behavioral Health Why: Please go to this provider for therapy and medication management services during walk in hours:  Monday through Wednesday from 7:45 am to 11:00 am.  Services are provided on a first come, first served basis.  Please arrive early. Contact information: [address] [place] [address] [phone]  Follow-up recommendations:  - Activity as tolerated. - Diet as recommended by PCP. - Keep all scheduled follow-up appointments as recommended.  Comments: Patient is instructed to take all prescribed medications as recommended. Report any side effects or adverse reactions to your outpatient psychiatrist. Patient is instructed to abstain from alcohol and illegal drugs while on prescription medications. In the event of worsening symptoms, patient is instructed to call the crisis hotline, 911, or go to the nearest emergency department for evaluation and treatment.  Signed: Lauro Franklin, MD 04/21/2021, 3:15 PM

## 2021-04-21 NOTE — Plan of Care (Signed)
Discharge note  Patient verbalizes readiness for discharge. Follow up plan explained, AVS, Transition record and SRA given. Prescriptions and teaching provided. Belongings returned and signed for. Suicide safety plan completed and signed. Patient verbalizes understanding. Patient denies SI/HI and assures this Clinical research associate they will seek assistance should that change. Patient discharged to lobby where husband was waiting.  Problem: Education: Goal: Knowledge of [place] [facility] Education information/materials will improve Outcome: Adequate for Discharge Goal: Emotional status will improve Outcome: Adequate for Discharge Goal: Mental status will improve Outcome: Adequate for Discharge Goal: Verbalization of understanding the information provided will improve Outcome: Adequate for Discharge   Problem: Activity: Goal: Interest or engagement in activities will improve Outcome: Adequate for Discharge Goal: Sleeping patterns will improve Outcome: Adequate for Discharge   Problem: Coping: Goal: Ability to verbalize frustrations and anger appropriately will improve Outcome: Adequate for Discharge Goal: Ability to demonstrate self-control will improve Outcome: Adequate for Discharge   Problem: Health Behavior/Discharge Planning: Goal: Identification of resources available to assist in meeting health care needs will improve Outcome: Adequate for Discharge Goal: Compliance with treatment plan for underlying cause of condition will improve 04/21/2021 1500 by Raylene Miyamoto, RN Outcome: Adequate for Discharge 04/21/2021 0920 by Raylene Miyamoto, RN Outcome: Progressing   Problem: Physical Regulation: Goal: Ability to maintain clinical measurements within normal limits will improve Outcome: Adequate for Discharge   Problem: Safety: Goal: Periods of time without injury will increase 04/21/2021 1500 by Raylene Miyamoto, RN Outcome: Adequate for Discharge 04/21/2021 0920 by Raylene Miyamoto, RN Outcome: Progressing   Problem: Education: Goal: Utilization of techniques to improve thought processes will improve Outcome: Adequate for Discharge Goal: Knowledge of the prescribed therapeutic regimen will improve 04/21/2021 1500 by Raylene Miyamoto, RN Outcome: Adequate for Discharge 04/21/2021 0920 by Raylene Miyamoto, RN Outcome: Progressing   Problem: Activity: Goal: Interest or engagement in leisure activities will improve Outcome: Adequate for Discharge Goal: Imbalance in normal sleep/wake cycle will improve Outcome: Adequate for Discharge   Problem: Coping: Goal: Coping ability will improve Outcome: Adequate for Discharge Goal: Will verbalize feelings Outcome: Adequate for Discharge   Problem: Health Behavior/Discharge Planning: Goal: Ability to make decisions will improve Outcome: Adequate for Discharge Goal: Compliance with therapeutic regimen will improve Outcome: Adequate for Discharge   Problem: Role Relationship: Goal: Will demonstrate positive changes in social behaviors and relationships Outcome: Adequate for Discharge   Problem: Safety: Goal: Ability to disclose and discuss suicidal ideas will improve Outcome: Adequate for Discharge Goal: Ability to identify and utilize support systems that promote safety will improve Outcome: Adequate for Discharge   Problem: Self-Concept: Goal: Will verbalize positive feelings about self Outcome: Adequate for Discharge Goal: Level of anxiety will decrease Outcome: Adequate for Discharge   Problem: Coping: Goal: Coping ability will improve Outcome: Adequate for Discharge   Problem: Health Behavior/Discharge Planning: Goal: Identification of resources available to assist in meeting health care needs will improve Outcome: Adequate for Discharge   Problem: Medication: Goal: Compliance with prescribed medication regimen will improve Outcome: Adequate for Discharge   Problem:  Self-Concept: Goal: Ability to disclose and discuss suicidal ideas will improve Outcome: Adequate for Discharge Goal: Will verbalize positive feelings about self Outcome: Adequate for Discharge   Problem: Coping: Goal: Ability to identify and develop effective coping behavior will improve Outcome: Adequate for Discharge   Problem: Self-Concept: Goal: Ability to identify factors that promote anxiety will improve Outcome: Adequate for Discharge Goal: Level of anxiety will decrease Outcome:  Adequate for Discharge Goal: Ability to modify response to factors that promote anxiety will improve Outcome: Adequate for Discharge

## 2021-04-21 NOTE — Progress Notes (Signed)
  Pt observed in milieu interacting with peers.  Pt presented with no complaints.  No medications administered.  Pt remains safe on unit with Q 15 minute safety checks.   04/20/21 2200  Psych Admission Type (Psych Patients Only)  Admission Status Involuntary  Psychosocial Assessment  Patient Complaints None  Eye Contact Fair  Facial Expression Animated  Affect Appropriate to circumstance  Speech Logical/coherent  Interaction Assertive  Motor Activity Slow  Appearance/Hygiene Unremarkable  Behavior Characteristics Cooperative;Appropriate to situation  Mood Pleasant  Thought Process  Coherency WDL  Content WDL  Delusions None reported or observed  Perception WDL  Hallucination None reported or observed  Judgment WDL  Confusion None  Danger to Self  Current suicidal ideation? Denies  Danger to Others  Danger to Others None reported or observed

## 2021-04-21 NOTE — Progress Notes (Signed)
Patient's chart was reviewed.  Hospitalist was asked to consult on this patient on 10/12 for abnormal LFTs.  AST and ALT noted to be 160 and 53 respectively on 10/12.  Based on initial consult note patient's examination was benign.  Abdomen was nontender.    Ultrasound of the right upper quadrant does not show any acute findings.  CBD is normal in size.  LFTs repeated today and show improvement in AST and ALT, 122 and 52 respectively.  Hepatitis panel was also ordered and is negative.  INR is normal.    Reason for elevated transaminases is not entirely clear but probably related to her recent overdose.  Acetaminophen level was not elevated.    Recommendation is to have patient's primary care provider repeat her LFTs in a week or so to ensure that they return to normal.    Discussed with psychiatry attending.  Plan is for discharge today.    This is a no charge note.  Osvaldo Shipper 04/21/2021

## 2021-04-21 NOTE — [facility] Group Notes (Signed)
[facility] Group Notes:  (Nursing/MHT/Case Management/Adjunct)  Date:  04/21/2021  Time:  12:00 PM  Type of Therapy:  Group Therapy  Participation Level:  Active  Participation Quality:  Appropriate  Affect:  Appropriate  Cognitive:  Appropriate  Insight:  Appropriate  Engagement in Group:  Engaged  Modes of Intervention:  Discussion and Education  Summary of Progress/Problems:Pt was engaged in orientation and goals group.  Jaquita Rector 04/21/2021, 12:00 PM

## 2021-04-21 NOTE — Plan of Care (Signed)
  Problem: Health Behavior/Discharge Planning: Goal: Compliance with treatment plan for underlying cause of condition will improve Outcome: Progressing   Problem: Safety: Goal: Periods of time without injury will increase Outcome: Progressing   Problem: Education: Goal: Knowledge of the prescribed therapeutic regimen will improve Outcome: Progressing   

## 2021-04-21 NOTE — Progress Notes (Signed)
  [facility] Adult Case Management Discharge Plan :  Will you be returning to the same living situation after discharge:  Yes,  Home  At discharge, do you have transportation home?: Yes,  Husband  Do you have the ability to pay for your medications: Yes,  Insurance   Release of information consent forms completed and in the chart;  Patient's signature needed at discharge.  Patient to Follow up at:  Follow-up Information     [facility] Follow up.   Specialty: Behavioral Health Why: Please go to this provider for therapy and medication management services during walk in hours:  Monday through Wednesday from 7:45 am to 11:00 am.  Services are provided on a first come, first served basis.  Please arrive early. Contact information: [address] [place] [address] [phone]                Next level of care provider has access to [facility] Link:yes  Safety Planning and Suicide Prevention discussed: Yes,  with husband and patient      Has patient been referred to the Quitline?: N/A patient is not a smoker  Patient has been referred for addiction treatment: N/A  [name], LCSWA 04/21/2021, 10:02 AM

## 2021-04-21 NOTE — [facility] Suicide Risk Assessment (Signed)
[facility] INPATIENT:  Family/Significant Other Suicide Prevention Education  Suicide Prevention Education:  Education Completed; Karlie Aung [phone] (Husband) has been identified by the patient as the family member/significant other with whom the patient will be residing, and identified as the person(s) who will aid the patient in the event of a mental health crisis (suicidal ideations/suicide attempt).  With written consent from the patient, the family member/significant other has been provided the following suicide prevention education, prior to the and/or following the discharge of the patient.  The suicide prevention education provided includes the following: Suicide risk factors Suicide prevention and interventions National Suicide Hotline telephone number [facility] assessment telephone number [facility] Emergency Assistance 911 [facility] and/or Residential Mobile Crisis Unit telephone number  Request made of family/significant other to: Remove weapons (e.g., guns, rifles, knives), all items previously/currently identified as safety concern.   Remove drugs/medications (over-the-counter, prescriptions, illicit drugs), all items previously/currently identified as a safety concern.  The family member/significant other verbalizes understanding of the suicide prevention education information provided.  The family member/significant other agrees to remove the items of safety concern listed above.  CSW spoke with Mr. Resh who states that his wife has been under a lot of pressure lately since having to quit her job.  He states that he has been trying to help her but that she has been overwhelmed.  He states that he has no concerns for his wife being at home with the children and that she is welcome to come back home.  Mr. Uffelman states that there are no firearms or weapons in the home.  Mr. Eschete states that he has also contacted his employer and has asked for employee  assistance to receive 4 free therapy sessions and additional referrals.  CSW completed SPE with Mr. Kumagai.   [employer] [place] Droll 04/21/2021, 9:55 AM

## 2021-04-21 NOTE — Care Management Important Message (Signed)
Medicare IM given to Angel Webster, LCSW to give to the patient. 

## 2021-04-21 NOTE — [facility] Suicide Risk Assessment (Signed)
[facility] Discharge Suicide Risk Assessment   Principal Problem: Adjustment disorder with depressed mood Discharge Diagnoses: Principal Problem:   Adjustment disorder with depressed mood  25 year old female with no psychiatric history was admitted to the psychiatric unit after suicide attempt via overdose.   During the patient's hospitalization, patient had extensive initial psychiatric evaluation, and follow-up psychiatric evaluations every day.She was diagnosed with adjustment disorder on admission. No medications were prescribed, as the first line treatment for her admitting diagnosis is psychotherapy, and she did not want to trial any psychiatric medication for target symptoms.   Patient's care was discussed during the interdisciplinary team meeting every day during the hospitalization.  The patient reports their target psychiatric symptoms of depression, anxiety, and suicidal thoughts, all responded well to the psychiatric medications, and the patient reports overall benefit other psychiatric hospitalization.   Labs were reviewed with the patient, and abnormal results were discussed with the patient. The patient denied having suicidal thoughts more than 48 hours prior to discharge.  Patient denies having homicidal thoughts.  Patient denies having auditory hallucinations.  Patient denies any visual hallucinations.  Patient denies having paranoid thoughts.  The patient is able to verbalize their individual safety plan to this provider. It is recommended to the patient to continue psychiatric medications as prescribed, after discharge from the hospital.    It is recommended to the patient to follow up with your outpatient psychiatric provider and PCP. Discussed with the patient, the impact of alcohol, drugs, tobacco have been there overall psychiatric and medical wellbeing, and abstaining from substance use was recommended the patient.    Total Time spent with patient: 30  minutes  Musculoskeletal: Strength & Muscle Tone: within normal limits Gait & Station: normal Patient leans: N/A  Psychiatric Specialty Exam  Presentation  General Appearance: Appropriate for Environment; Casual; Fairly Groomed  Eye Contact:Good  Speech:Clear and Coherent; Normal Rate  Speech Volume:Normal  Handedness: No data recorded  Mood and Affect  Mood:Euthymic  Duration of Depression Symptoms: Less than two weeks  Affect:Appropriate; Congruent; Full Range   Thought Process  Thought Processes:Coherent; Irrevelant  Descriptions of Associations:Intact  Orientation:Full (Time, Place and Person)  Thought Content:Logical  History of Schizophrenia/Schizoaffective disorder:No  Duration of Psychotic Symptoms:No data recorded Hallucinations:Hallucinations: None  Ideas of Reference:None  Suicidal Thoughts:Suicidal Thoughts: No  Homicidal Thoughts:Homicidal Thoughts: No   Sensorium  Memory:Immediate Good; Recent Good; Remote Good  Judgment:Good  Insight:Good   Executive Functions  Concentration:Good  Attention Span:Good  Recall:Good  Fund of Knowledge:Good  Language:Good   Psychomotor Activity  Psychomotor Activity:Psychomotor Activity: Normal   Assets  Assets:Communication Skills; Physical Health; Desire for Improvement   Sleep  Sleep:Sleep: Good Number of Hours of Sleep: 6.75   Physical Exam: Physical Exam dee dc summary ROS see dc summary Blood pressure 126/87, pulse 90, temperature 98.2 F (36.8 C), temperature source Oral, resp. rate 18, height 5\' 6"  (1.676 m), weight 72.6 kg, SpO2 100 %. Body mass index is 25.82 kg/m.  Mental Status Per Nursing Assessment::   On Admission:  NA  Demographic Factors:  Adolescent or young adult  Loss Factors: Change in job status  Historical Factors: Family history of mental illness or substance abuse and Impulsivity  Risk Reduction Factors:   Responsible for children under 18 years  of age, Sense of responsibility to family, Living with another person, especially a relative, Positive social support, Positive therapeutic relationship, and Positive coping skills or problem solving skills  Continued Clinical Symptoms:  Severe Anxiety and/or Agitation  Cognitive Features That Contribute To Risk:  None    Suicide Risk:  Mild:   There are no identifiable suicide plans, no associated intent, mild dysphoria and related symptoms, good self-control (both objective and subjective assessment), few other risk factors, and identifiable protective factors, including available and accessible social support.   Follow-up Information     [facility] Follow up.   Specialty: Behavioral Health Why: Please go to this provider for therapy and medication management services during walk in hours:  Monday through Wednesday from 7:45 am to 11:00 am.  Services are provided on a first come, first served basis.  Please arrive early. Contact information: [phone] [place] [address] [phone]                Plan Of Care/Follow-up recommendations:   Activity: as tolerated  Diet: heart healthy  Other: -Follow-up with your outpatient psychiatric provider -instructions on appointment date, time, and address (location) are provided to you in discharge paperwork.  -Follow-up with outpatient primary care doctor and other specialists to follow-up abnormal liver function tests.   -Testing: Follow-up with outpatient provider for abnormal lab results: liver function panel test on 04-28-21.   -Recommend cessation of alcohol, tobacco, and other illicit drug use at discharge.   -If your psychiatric symptoms recur, worsening, or if you have side effects to your psychiatric medications, call your outpatient psychiatric provider, 911, [phone] or go to the nearest emergency department. -If suicidal thoughts recur, call your outpatient psychiatric provider,  [phone], [phone] or go to the nearest emergency department.   Cristy Hilts, MD 04/21/2021, 1:43 PM

## 2021-04-22 LAB — HEPATITIS PANEL, ACUTE
Hep A IgM: NONREACTIVE
Hep B C IgM: NONREACTIVE
Hepatitis B Surface Ag: NONREACTIVE

## 2021-04-22 LAB — HEPATITIS C ANTIBODY: HCV Ab: <0.1 s/co ratio — AB (ref 0.0–0.9)

## 2021-05-10 DIAGNOSIS — Z419 Encounter for procedure for purposes other than remedying health state, unspecified: Secondary | ICD-10-CM | POA: Diagnosis not present

## 2021-05-20 DIAGNOSIS — Z3042 Encounter for surveillance of injectable contraceptive: Secondary | ICD-10-CM | POA: Diagnosis not present

## 2021-06-09 DIAGNOSIS — Z419 Encounter for procedure for purposes other than remedying health state, unspecified: Secondary | ICD-10-CM | POA: Diagnosis not present

## 2021-07-10 DIAGNOSIS — Z419 Encounter for procedure for purposes other than remedying health state, unspecified: Secondary | ICD-10-CM | POA: Diagnosis not present

## 2021-07-20 ENCOUNTER — Emergency Department (HOSPITAL_COMMUNITY)
Admission: EM | Admit: 2021-07-20 | Discharge: 2021-07-21 | Disposition: A | Discharge status: Home / Self Care | Payer: Medicaid Other | Attending: Emergency Medicine | Admitting: Emergency Medicine

## 2021-07-20 ENCOUNTER — Emergency Department (HOSPITAL_COMMUNITY): Payer: Medicaid Other

## 2021-07-20 ENCOUNTER — Other Ambulatory Visit: Payer: Self-pay

## 2021-07-20 DIAGNOSIS — Y92410 Unspecified street and highway as the place of occurrence of the external cause: Secondary | ICD-10-CM | POA: Insufficient documentation

## 2021-07-20 DIAGNOSIS — Z5321 Procedure and treatment not carried out due to patient leaving prior to being seen by health care provider: Secondary | ICD-10-CM | POA: Insufficient documentation

## 2021-07-20 DIAGNOSIS — Z041 Encounter for examination and observation following transport accident: Secondary | ICD-10-CM | POA: Diagnosis not present

## 2021-07-20 DIAGNOSIS — M25512 Pain in left shoulder: Secondary | ICD-10-CM | POA: Diagnosis not present

## 2021-07-20 DIAGNOSIS — R519 Headache, unspecified: Secondary | ICD-10-CM | POA: Diagnosis not present

## 2021-07-20 DIAGNOSIS — R9431 Abnormal electrocardiogram [ECG] [EKG]: Secondary | ICD-10-CM | POA: Diagnosis not present

## 2021-07-20 DIAGNOSIS — M545 Low back pain, unspecified: Secondary | ICD-10-CM | POA: Diagnosis not present

## 2021-07-20 DIAGNOSIS — N9489 Other specified conditions associated with female genital organs and menstrual cycle: Secondary | ICD-10-CM | POA: Insufficient documentation

## 2021-07-20 DIAGNOSIS — S0990XA Unspecified injury of head, initial encounter: Secondary | ICD-10-CM | POA: Diagnosis not present

## 2021-07-20 LAB — CBC WITH DIFFERENTIAL/PLATELET
Abs Immature Granulocytes: 0.02 10*3/uL (ref 0.00–0.07)
Basophils Absolute: 0 10*3/uL (ref 0.0–0.1)
Basophils Relative: 0 %
Eosinophils Absolute: 0.1 10*3/uL (ref 0.0–0.5)
Eosinophils Relative: 1 %
HCT: 40.1 % (ref 36.0–46.0)
Hemoglobin: 12.4 g/dL (ref 12.0–15.0)
Immature Granulocytes: 0 %
Lymphocytes Relative: 26 %
Lymphs Abs: 2.3 10*3/uL (ref 0.7–4.0)
MCH: 26.8 pg (ref 26.0–34.0)
MCHC: 30.9 g/dL (ref 30.0–36.0)
MCV: 86.6 fL (ref 80.0–100.0)
Monocytes Absolute: 0.4 10*3/uL (ref 0.1–1.0)
Monocytes Relative: 5 %
Neutro Abs: 6 10*3/uL (ref 1.7–7.7)
Neutrophils Relative %: 68 %
Platelets: 251 10*3/uL (ref 150–400)
RBC: 4.63 MIL/uL (ref 3.87–5.11)
RDW: 12.8 % (ref 11.5–15.5)
WBC: 8.8 10*3/uL (ref 4.0–10.5)
nRBC: 0 % (ref 0.0–0.2)

## 2021-07-20 LAB — BASIC METABOLIC PANEL
Anion gap: 8 (ref 5–15)
BUN: 8 mg/dL (ref 6–20)
CO2: 25 mmol/L (ref 22–32)
Calcium: 9.4 mg/dL (ref 8.9–10.3)
Chloride: 106 mmol/L (ref 98–111)
Creatinine, Ser: 0.86 mg/dL (ref 0.44–1.00)
GFR, Estimated: >60 mL/min (ref >60)
Glucose, Bld: 98 mg/dL (ref 70–99)
Potassium: 3.8 mmol/L (ref 3.5–5.1)
Sodium: 139 mmol/L (ref 135–145)

## 2021-07-20 LAB — I-STAT BETA HCG BLOOD, ED (MC, WL, AP ONLY): I-stat hCG, quantitative: <5 m[IU]/mL (ref <5)
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
## 2021-07-21 NOTE — ED Notes (Signed)
X2 no response °

## 2021-07-21 NOTE — ED Notes (Signed)
Pt called x1 for vitals 

## 2021-08-10 DIAGNOSIS — Z419 Encounter for procedure for purposes other than remedying health state, unspecified: Secondary | ICD-10-CM | POA: Diagnosis not present

## 2021-09-07 DIAGNOSIS — Z419 Encounter for procedure for purposes other than remedying health state, unspecified: Secondary | ICD-10-CM | POA: Diagnosis not present

## 2021-10-08 DIAGNOSIS — Z419 Encounter for procedure for purposes other than remedying health state, unspecified: Secondary | ICD-10-CM | POA: Diagnosis not present

## 2021-11-07 DIAGNOSIS — Z419 Encounter for procedure for purposes other than remedying health state, unspecified: Secondary | ICD-10-CM | POA: Diagnosis not present

## 2021-12-08 DIAGNOSIS — Z419 Encounter for procedure for purposes other than remedying health state, unspecified: Secondary | ICD-10-CM | POA: Diagnosis not present

## 2022-01-07 DIAGNOSIS — Z419 Encounter for procedure for purposes other than remedying health state, unspecified: Secondary | ICD-10-CM | POA: Diagnosis not present

## 2022-02-07 DIAGNOSIS — Z419 Encounter for procedure for purposes other than remedying health state, unspecified: Secondary | ICD-10-CM | POA: Diagnosis not present

## 2022-03-10 DIAGNOSIS — Z419 Encounter for procedure for purposes other than remedying health state, unspecified: Secondary | ICD-10-CM | POA: Diagnosis not present

## 2022-03-13 DIAGNOSIS — H5213 Myopia, bilateral: Secondary | ICD-10-CM | POA: Diagnosis not present

## 2022-03-20 DIAGNOSIS — N3001 Acute cystitis with hematuria: Secondary | ICD-10-CM | POA: Diagnosis not present

## 2022-04-09 DIAGNOSIS — Z419 Encounter for procedure for purposes other than remedying health state, unspecified: Secondary | ICD-10-CM | POA: Diagnosis not present

## 2022-05-06 ENCOUNTER — Other Ambulatory Visit: Payer: Self-pay

## 2022-05-06 ENCOUNTER — Encounter (HOSPITAL_COMMUNITY): Payer: Self-pay

## 2022-05-06 ENCOUNTER — Emergency Department (HOSPITAL_COMMUNITY)
Admission: EM | Admit: 2022-05-06 | Discharge: 2022-05-06 | Discharge status: Against Medical Advice | Payer: Managed Care, Other (non HMO) | Attending: Emergency Medicine | Admitting: Emergency Medicine

## 2022-05-06 DIAGNOSIS — Z5321 Procedure and treatment not carried out due to patient leaving prior to being seen by health care provider: Secondary | ICD-10-CM | POA: Insufficient documentation

## 2022-05-06 DIAGNOSIS — R509 Fever, unspecified: Secondary | ICD-10-CM | POA: Diagnosis present

## 2022-05-06 DIAGNOSIS — R531 Weakness: Secondary | ICD-10-CM | POA: Insufficient documentation

## 2022-05-06 DIAGNOSIS — M791 Myalgia, unspecified site: Secondary | ICD-10-CM | POA: Diagnosis not present

## 2022-05-06 LAB — COMPREHENSIVE METABOLIC PANEL
ALT: 19 U/L (ref 0–44)
AST: 24 U/L (ref 15–41)
Albumin: 3 g/dL — ABNORMAL LOW (ref 3.5–5.0)
Alkaline Phosphatase: 33 U/L — ABNORMAL LOW (ref 38–126)
Anion gap: 7 (ref 5–15)
BUN: 8 mg/dL (ref 6–20)
CO2: 25 mmol/L (ref 22–32)
Calcium: 7.9 mg/dL — ABNORMAL LOW (ref 8.9–10.3)
Chloride: 106 mmol/L (ref 98–111)
Creatinine, Ser: 0.91 mg/dL (ref 0.44–1.00)
GFR, Estimated: >60 mL/min (ref >60)
Glucose, Bld: 99 mg/dL (ref 70–99)
Potassium: 3.9 mmol/L (ref 3.5–5.1)
Sodium: 138 mmol/L (ref 135–145)
Total Bilirubin: 0.4 mg/dL (ref 0.3–1.2)
Total Protein: 5.5 g/dL — ABNORMAL LOW (ref 6.5–8.1)

## 2022-05-06 LAB — CBC WITH DIFFERENTIAL/PLATELET
Abs Immature Granulocytes: 0.04 10*3/uL (ref 0.00–0.07)
Basophils Absolute: 0 10*3/uL (ref 0.0–0.1)
Basophils Relative: 0 %
Eosinophils Absolute: 0 10*3/uL (ref 0.0–0.5)
Eosinophils Relative: 0 %
HCT: 36.6 % (ref 36.0–46.0)
Hemoglobin: 11.8 g/dL — ABNORMAL LOW (ref 12.0–15.0)
Immature Granulocytes: 1 %
Lymphocytes Relative: 17 %
Lymphs Abs: 1.2 10*3/uL (ref 0.7–4.0)
MCH: 28.2 pg (ref 26.0–34.0)
MCHC: 32.2 g/dL (ref 30.0–36.0)
MCV: 87.4 fL (ref 80.0–100.0)
Monocytes Absolute: 0.4 10*3/uL (ref 0.1–1.0)
Monocytes Relative: 5 %
Neutro Abs: 5.3 10*3/uL (ref 1.7–7.7)
Neutrophils Relative %: 77 %
Platelets: 170 10*3/uL (ref 150–400)
RBC: 4.19 MIL/uL (ref 3.87–5.11)
RDW: 13 % (ref 11.5–15.5)
WBC: 6.9 10*3/uL (ref 4.0–10.5)
nRBC: 0 % (ref 0.0–0.2)

## 2022-05-06 LAB — I-STAT BETA HCG BLOOD, ED (MC, WL, AP ONLY): I-stat hCG, quantitative: <5 m[IU]/mL (ref <5)

## 2022-05-06 MED ORDER — IBUPROFEN 800 MG PO TABS
800.0000 mg | ORAL_TABLET | Freq: Once | ORAL | Status: AC
  Administered 2022-05-06: 800 mg via ORAL
  Filled 2022-05-06: qty 1

## 2022-05-06 NOTE — ED Triage Notes (Signed)
Patient w/ flu like symptoms for 5 days. Orthostatically hypotensive. Last BP 94/64 after 500 mls of fluid.

## 2022-05-06 NOTE — ED Notes (Signed)
NA x5 vitals

## 2022-05-06 NOTE — ED Triage Notes (Signed)
Patient complains of several days of body aches, fever, and states I have flu symptoms. Patient has not eaten today. Alert and oriented

## 2022-05-06 NOTE — ED Provider Triage Note (Signed)
Emergency Medicine Provider Triage Evaluation Note  Larene Pickett , a 26 y.o. female  was evaluated in triage.  Pt complains of flu like symptoms for the past few day (chills, body aches), went to UC today for BV. Went to the pharmacy to get her medication but felt weak, EMS was called and told her BP was low (96 systolic, stool up and BP 213 systolic).   Review of Systems  Positive: As above Negative: As above  Physical Exam  There were no vitals taken for this visit. Gen:   Awake, no distress   Resp:  Normal effort  MSK:   Moves extremities without difficulty  Other:    Medical Decision Making  Medically screening exam initiated at 2:13 PM.  Appropriate orders placed.  Keeva Nichole Sao was informed that the remainder of the evaluation will be completed by another provider, this initial triage assessment does not replace that evaluation, and the importance of remaining in the ED until their evaluation is complete.     Tacy Learn, PA-C 05/06/22 1416

## 2022-05-10 DIAGNOSIS — Z419 Encounter for procedure for purposes other than remedying health state, unspecified: Secondary | ICD-10-CM | POA: Diagnosis not present

## 2022-06-09 DIAGNOSIS — Z419 Encounter for procedure for purposes other than remedying health state, unspecified: Secondary | ICD-10-CM | POA: Diagnosis not present

## 2022-07-10 DIAGNOSIS — Z419 Encounter for procedure for purposes other than remedying health state, unspecified: Secondary | ICD-10-CM | POA: Diagnosis not present

## 2022-08-10 DIAGNOSIS — Z419 Encounter for procedure for purposes other than remedying health state, unspecified: Secondary | ICD-10-CM | POA: Diagnosis not present

## 2022-09-08 DIAGNOSIS — Z419 Encounter for procedure for purposes other than remedying health state, unspecified: Secondary | ICD-10-CM | POA: Diagnosis not present

## 2022-10-09 DIAGNOSIS — Z419 Encounter for procedure for purposes other than remedying health state, unspecified: Secondary | ICD-10-CM | POA: Diagnosis not present

## 2022-11-08 DIAGNOSIS — Z419 Encounter for procedure for purposes other than remedying health state, unspecified: Secondary | ICD-10-CM | POA: Diagnosis not present

## 2022-12-09 DIAGNOSIS — Z419 Encounter for procedure for purposes other than remedying health state, unspecified: Secondary | ICD-10-CM | POA: Diagnosis not present

## 2022-12-11 DIAGNOSIS — Z3201 Encounter for pregnancy test, result positive: Secondary | ICD-10-CM | POA: Diagnosis not present

## 2022-12-13 DIAGNOSIS — Z3201 Encounter for pregnancy test, result positive: Secondary | ICD-10-CM | POA: Diagnosis not present

## 2022-12-15 DIAGNOSIS — Z3201 Encounter for pregnancy test, result positive: Secondary | ICD-10-CM | POA: Diagnosis not present

## 2022-12-18 DIAGNOSIS — Z3201 Encounter for pregnancy test, result positive: Secondary | ICD-10-CM | POA: Diagnosis not present

## 2023-01-08 DIAGNOSIS — Z419 Encounter for procedure for purposes other than remedying health state, unspecified: Secondary | ICD-10-CM | POA: Diagnosis not present

## 2023-01-10 DIAGNOSIS — O09211 Supervision of pregnancy with history of pre-term labor, first trimester: Secondary | ICD-10-CM | POA: Diagnosis not present

## 2023-01-10 DIAGNOSIS — Z1332 Encounter for screening for maternal depression: Secondary | ICD-10-CM | POA: Diagnosis not present

## 2023-01-10 DIAGNOSIS — Z3A08 8 weeks gestation of pregnancy: Secondary | ICD-10-CM | POA: Diagnosis not present

## 2023-01-22 DIAGNOSIS — Z3A10 10 weeks gestation of pregnancy: Secondary | ICD-10-CM | POA: Diagnosis not present

## 2023-01-22 DIAGNOSIS — Z3481 Encounter for supervision of other normal pregnancy, first trimester: Secondary | ICD-10-CM | POA: Diagnosis not present

## 2023-02-08 DIAGNOSIS — Z419 Encounter for procedure for purposes other than remedying health state, unspecified: Secondary | ICD-10-CM | POA: Diagnosis not present

## 2023-03-11 DIAGNOSIS — Z419 Encounter for procedure for purposes other than remedying health state, unspecified: Secondary | ICD-10-CM | POA: Diagnosis not present

## 2023-04-10 DIAGNOSIS — Z419 Encounter for procedure for purposes other than remedying health state, unspecified: Secondary | ICD-10-CM | POA: Diagnosis not present

## 2023-05-11 DIAGNOSIS — Z419 Encounter for procedure for purposes other than remedying health state, unspecified: Secondary | ICD-10-CM | POA: Diagnosis not present

## 2023-06-10 DIAGNOSIS — Z419 Encounter for procedure for purposes other than remedying health state, unspecified: Secondary | ICD-10-CM | POA: Diagnosis not present
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
## 2023-07-11 DIAGNOSIS — Z419 Encounter for procedure for purposes other than remedying health state, unspecified: Secondary | ICD-10-CM | POA: Diagnosis not present

## 2023-07-12 DIAGNOSIS — O3663X0 Maternal care for excessive fetal growth, third trimester, not applicable or unspecified: Secondary | ICD-10-CM | POA: Diagnosis not present

## 2023-07-12 DIAGNOSIS — Z3A34 34 weeks gestation of pregnancy: Secondary | ICD-10-CM | POA: Diagnosis not present

## 2023-07-27 DIAGNOSIS — Z3483 Encounter for supervision of other normal pregnancy, third trimester: Secondary | ICD-10-CM | POA: Diagnosis not present

## 2023-08-10 DIAGNOSIS — Z3A38 38 weeks gestation of pregnancy: Secondary | ICD-10-CM | POA: Diagnosis not present

## 2023-08-10 DIAGNOSIS — Z3483 Encounter for supervision of other normal pregnancy, third trimester: Secondary | ICD-10-CM | POA: Diagnosis not present

## 2023-08-11 DIAGNOSIS — Z419 Encounter for procedure for purposes other than remedying health state, unspecified: Secondary | ICD-10-CM | POA: Diagnosis not present

## 2023-08-14 DIAGNOSIS — K219 Gastro-esophageal reflux disease without esophagitis: Secondary | ICD-10-CM | POA: Diagnosis not present

## 2023-08-14 DIAGNOSIS — D508 Other iron deficiency anemias: Secondary | ICD-10-CM | POA: Diagnosis not present

## 2023-08-14 DIAGNOSIS — Z3483 Encounter for supervision of other normal pregnancy, third trimester: Secondary | ICD-10-CM | POA: Diagnosis not present

## 2023-08-14 DIAGNOSIS — O9902 Anemia complicating childbirth: Secondary | ICD-10-CM | POA: Diagnosis not present

## 2023-08-14 DIAGNOSIS — Z3A39 39 weeks gestation of pregnancy: Secondary | ICD-10-CM | POA: Diagnosis not present

## 2023-08-14 DIAGNOSIS — Z79899 Other long term (current) drug therapy: Secondary | ICD-10-CM | POA: Diagnosis not present

## 2023-08-14 DIAGNOSIS — O99824 Streptococcus B carrier state complicating childbirth: Secondary | ICD-10-CM | POA: Diagnosis not present

## 2023-08-14 DIAGNOSIS — O9962 Diseases of the digestive system complicating childbirth: Secondary | ICD-10-CM | POA: Diagnosis not present

## 2023-09-08 DIAGNOSIS — Z419 Encounter for procedure for purposes other than remedying health state, unspecified: Secondary | ICD-10-CM | POA: Diagnosis not present

## 2023-09-14 DIAGNOSIS — B370 Candidal stomatitis: Secondary | ICD-10-CM | POA: Diagnosis not present

## 2023-10-20 DIAGNOSIS — Z419 Encounter for procedure for purposes other than remedying health state, unspecified: Secondary | ICD-10-CM | POA: Diagnosis not present

## 2023-11-19 DIAGNOSIS — Z419 Encounter for procedure for purposes other than remedying health state, unspecified: Secondary | ICD-10-CM | POA: Diagnosis not present

## 2023-12-20 DIAGNOSIS — Z419 Encounter for procedure for purposes other than remedying health state, unspecified: Secondary | ICD-10-CM | POA: Diagnosis not present

## 2024-01-19 DIAGNOSIS — Z419 Encounter for procedure for purposes other than remedying health state, unspecified: Secondary | ICD-10-CM | POA: Diagnosis not present

## 2024-02-19 DIAGNOSIS — Z419 Encounter for procedure for purposes other than remedying health state, unspecified: Secondary | ICD-10-CM | POA: Diagnosis not present

## 2024-02-22 DIAGNOSIS — Z3202 Encounter for pregnancy test, result negative: Secondary | ICD-10-CM | POA: Diagnosis not present

## 2024-02-22 DIAGNOSIS — N898 Other specified noninflammatory disorders of vagina: Secondary | ICD-10-CM | POA: Diagnosis not present

## 2024-02-22 DIAGNOSIS — R3989 Other symptoms and signs involving the genitourinary system: Secondary | ICD-10-CM | POA: Diagnosis not present

## 2024-03-21 DIAGNOSIS — Z419 Encounter for procedure for purposes other than remedying health state, unspecified: Secondary | ICD-10-CM | POA: Diagnosis not present

## 2024-04-20 DIAGNOSIS — Z419 Encounter for procedure for purposes other than remedying health state, unspecified: Secondary | ICD-10-CM | POA: Diagnosis not present
